# Patient Record
Sex: Male | Born: 1955 | Race: White | Hispanic: No | Marital: Married | Smoking: Former smoker
Health system: Southern US, Community
[De-identification: ages and names within clinical notes are randomized; demographics above are authoritative.]

## PROBLEM LIST (undated history)

## (undated) DIAGNOSIS — K219 Gastro-esophageal reflux disease without esophagitis: Secondary | ICD-10-CM

## (undated) DIAGNOSIS — K649 Unspecified hemorrhoids: Secondary | ICD-10-CM

## (undated) HISTORY — PX: CARDIAC CATHETERIZATION: SHX172

---

## 2003-05-28 ENCOUNTER — Other Ambulatory Visit: Admission: RE | Admit: 2003-05-28 | Discharge: 2003-05-28 | Payer: Self-pay | Admitting: Dermatology

## 2003-12-13 ENCOUNTER — Ambulatory Visit (HOSPITAL_COMMUNITY): Admission: RE | Admit: 2003-12-13 | Discharge: 2003-12-13 | Payer: Self-pay | Admitting: Family Medicine

## 2004-02-12 ENCOUNTER — Ambulatory Visit (HOSPITAL_COMMUNITY): Admission: RE | Admit: 2004-02-12 | Discharge: 2004-02-12 | Payer: Self-pay | Admitting: Family Medicine

## 2004-09-14 ENCOUNTER — Other Ambulatory Visit: Admission: RE | Admit: 2004-09-14 | Discharge: 2004-09-14 | Payer: Self-pay | Admitting: Dermatology

## 2005-03-01 ENCOUNTER — Ambulatory Visit (HOSPITAL_COMMUNITY): Admission: RE | Admit: 2005-03-01 | Discharge: 2005-03-01 | Payer: Self-pay | Admitting: Neurosurgery

## 2005-04-29 ENCOUNTER — Ambulatory Visit (HOSPITAL_COMMUNITY): Admission: RE | Admit: 2005-04-29 | Discharge: 2005-04-29 | Payer: Self-pay | Admitting: Family Medicine

## 2005-05-21 ENCOUNTER — Ambulatory Visit (HOSPITAL_COMMUNITY): Admission: RE | Admit: 2005-05-21 | Discharge: 2005-05-21 | Payer: Self-pay | Admitting: Preventative Medicine

## 2005-09-22 ENCOUNTER — Ambulatory Visit (HOSPITAL_COMMUNITY): Admission: RE | Admit: 2005-09-22 | Discharge: 2005-09-22 | Payer: Self-pay | Admitting: Family Medicine

## 2006-11-12 ENCOUNTER — Emergency Department (HOSPITAL_COMMUNITY): Admission: EM | Admit: 2006-11-12 | Discharge: 2006-11-12 | Payer: Self-pay | Admitting: Emergency Medicine

## 2008-05-15 ENCOUNTER — Ambulatory Visit (HOSPITAL_COMMUNITY): Admission: RE | Admit: 2008-05-15 | Discharge: 2008-05-15 | Payer: Self-pay | Admitting: General Surgery

## 2008-11-02 ENCOUNTER — Emergency Department (HOSPITAL_COMMUNITY): Admission: EM | Admit: 2008-11-02 | Discharge: 2008-11-02 | Payer: Self-pay | Admitting: Family Medicine

## 2010-08-17 LAB — BASIC METABOLIC PANEL
BUN: 15 mg/dL (ref 6–23)
CO2: 26 mEq/L (ref 19–32)
Calcium: 8.9 mg/dL (ref 8.4–10.5)
Chloride: 103 mEq/L (ref 96–112)
Creatinine, Ser: 0.86 mg/dL (ref 0.4–1.5)
GFR calc Af Amer: >60 mL/min (ref >60)
GFR calc non Af Amer: >60 mL/min (ref >60)
Glucose, Bld: 96 mg/dL (ref 70–99)
Potassium: 3.9 mEq/L (ref 3.5–5.1)
Sodium: 135 mEq/L (ref 135–145)

## 2010-08-17 LAB — CBC
HCT: 45.3 % (ref 39.0–52.0)
Hemoglobin: 15.3 g/dL (ref 13.0–17.0)
MCHC: 33.8 g/dL (ref 30.0–36.0)
MCV: 95.8 fL (ref 78.0–100.0)
Platelets: 174 10*3/uL (ref 150–400)
RBC: 4.73 MIL/uL (ref 4.22–5.81)
RDW: 11.9 % (ref 11.5–15.5)
WBC: 12.4 10*3/uL — ABNORMAL HIGH (ref 4.0–10.5)

## 2010-08-17 LAB — CULTURE, ROUTINE-ABSCESS

## 2010-08-17 LAB — ANAEROBIC CULTURE

## 2010-09-15 NOTE — Op Note (Signed)
NAME:  Robert Montoya, Robert Montoya NO.:  [id number]   MEDICAL RECORD NO.:  [id number]          PATIENT TYPE:  AMB   LOCATION:  DAY                           FACILITY:  [facility]   PHYSICIAN:  Dalia Heading, M.D.  DATE OF BIRTH:  1956-03-07   DATE OF PROCEDURE:  05/15/2008  DATE OF DISCHARGE:                               OPERATIVE REPORT   Age, 55 years old.   DATE OF PROCEDURE:  May 15, 2008   PREOPERATIVE DIAGNOSIS:  Scrotal abscess.   POSTOPERATIVE DIAGNOSIS:  Scrotal abscess.   PROCEDURE:  Incision and drainage of scrotal abscess.   SURGEON:  Dalia Heading, MD   ANESTHESIA:  General.   INDICATIONS:  The patient is a 55 year old white male who has developed  a scrotal abscess along the left upper aspect of the scrotum.  The risks  and benefits of the procedure were fully explained to the patient, gave  informed consent.   PROCEDURE NOTE:  The patient was placed in supine position.  After  general anesthesia was administered, the scrotum was prepped and draped  in the usual sterile technique with Betadine.  Surgical site  confirmation was performed.   The patient had a draining wound at the level of the penile base.  This  was to the left of the penile base.  The abscess cavity was opened up  and noted to be tracking up towards the inguinal canal.  Approximately 3-  4 cm incision was made.  Purulent fluid was found.  This was sent for  both aerobic and anaerobic cultures.  Any necrotic tissue was debrided.  The wound was then instilled with 0.5% Sensorcaine.  Betadine  impregnated packing was then placed into this wound.  A dry sterile  dressing was then applied.   All tape and needle counts were correct at the end of procedure.  The  patient was awakened and transferred to PACU in stable condition.  Complications none.  Specimens, aerobic and anaerobic cultures of  scrotal abscess.  Blood loss minimal.      Dalia Heading, M.D.  Electronically  Signed     MAJ/MEDQ  D:  05/15/2008  T:  05/16/2008  Job:  [id number]   cc:   Angus G. Renard Matter, MD  Fax: [phone]

## 2010-09-15 NOTE — H&P (Signed)
NAME:  Robert Montoya, DEZIEL NO.:  [id number]   MEDICAL RECORD NO.:  [id number]          PATIENT TYPE:  AMB   LOCATION:  DAY                           FACILITY:  [facility]   PHYSICIAN:  Dalia Heading, M.D.  DATE OF BIRTH:  23-Oct-1955   DATE OF ADMISSION:  DATE OF DISCHARGE:  LH                              HISTORY & PHYSICAL   CHIEF COMPLAINT:  Scrotal abscess.   HISTORY OF PRESENT ILLNESS:  The patient is a 55 year old white male who  is referred for evaluation and treatment of a scrotal abscess.  It  started developing several days ago.  Minimal drainage is noted.   PAST MEDICAL HISTORY:  Unremarkable.   PAST SURGICAL HISTORY:  Unremarkable.   CURRENT MEDICATIONS:  None.   ALLERGIES:  NO KNOWN DRUG ALLERGIES.   REVIEW OF SYSTEMS:  Noncontributory.   On physical examination, the patient is a well-developed, well-nourished  white male in no acute distress.  Lungs are clear to auscultation with equal breath sounds bilaterally.  Heart examination reveals a regular rate and rhythm without S3 or S4,  murmurs.  Genitourinary examination reveals an indurated area along the left  lateral aspect of the upper scrotum with minimal drainage noted.   IMPRESSION:  Scrotal abscess.   PLAN:  The patient is scheduled for incision and drainage of the scrotal  abscess on May 15, 2008.  The risks and benefits of the procedure,  including bleeding and infection, were fully explained to the patient,  who gave informed consent.  He has been started on Bactrim and Percocet  for pain.      Dalia Heading, M.D.  Electronically Signed     MAJ/MEDQ  D:  05/14/2008  T:  05/14/2008  Job:  [id number]   cc:   Dalia Heading, M.D.  Fax: [phone]   Short Stay Unit   Angus G. Renard Matter, MD  Fax: [phone]

## 2011-07-01 ENCOUNTER — Ambulatory Visit (HOSPITAL_COMMUNITY)
Admission: RE | Admit: 2011-07-01 | Discharge: 2011-07-01 | Disposition: A | Discharge status: Home / Self Care | Payer: 59 | Source: Ambulatory Visit | Attending: Family Medicine | Admitting: Family Medicine

## 2011-07-01 ENCOUNTER — Other Ambulatory Visit (HOSPITAL_COMMUNITY): Payer: Self-pay | Admitting: Family Medicine

## 2011-07-01 DIAGNOSIS — M542 Cervicalgia: Secondary | ICD-10-CM

## 2011-07-01 DIAGNOSIS — M47812 Spondylosis without myelopathy or radiculopathy, cervical region: Secondary | ICD-10-CM | POA: Insufficient documentation

## 2011-07-13 ENCOUNTER — Other Ambulatory Visit (HOSPITAL_COMMUNITY): Payer: Self-pay | Admitting: Family Medicine

## 2011-07-13 DIAGNOSIS — M542 Cervicalgia: Secondary | ICD-10-CM

## 2011-07-15 ENCOUNTER — Ambulatory Visit (HOSPITAL_COMMUNITY)
Admission: RE | Admit: 2011-07-15 | Discharge: 2011-07-15 | Disposition: A | Discharge status: Home / Self Care | Payer: 59 | Source: Ambulatory Visit | Attending: Family Medicine | Admitting: Family Medicine

## 2011-07-15 DIAGNOSIS — R209 Unspecified disturbances of skin sensation: Secondary | ICD-10-CM | POA: Insufficient documentation

## 2011-07-15 DIAGNOSIS — M5020 Other cervical disc displacement, unspecified cervical region: Secondary | ICD-10-CM | POA: Insufficient documentation

## 2011-07-15 DIAGNOSIS — M542 Cervicalgia: Secondary | ICD-10-CM | POA: Insufficient documentation

## 2011-07-15 DIAGNOSIS — J329 Chronic sinusitis, unspecified: Secondary | ICD-10-CM | POA: Insufficient documentation

## 2011-07-20 ENCOUNTER — Other Ambulatory Visit: Payer: Self-pay | Admitting: Family Medicine

## 2011-07-20 DIAGNOSIS — M542 Cervicalgia: Secondary | ICD-10-CM

## 2011-07-20 DIAGNOSIS — M79602 Pain in left arm: Secondary | ICD-10-CM

## 2011-07-22 ENCOUNTER — Ambulatory Visit
Admission: RE | Admit: 2011-07-22 | Discharge: 2011-07-22 | Disposition: A | Discharge status: Home / Self Care | Payer: 59 | Source: Ambulatory Visit | Attending: Family Medicine | Admitting: Family Medicine

## 2011-07-22 DIAGNOSIS — M79602 Pain in left arm: Secondary | ICD-10-CM

## 2011-07-22 DIAGNOSIS — M542 Cervicalgia: Secondary | ICD-10-CM

## 2011-07-22 MED ORDER — TRIAMCINOLONE ACETONIDE 40 MG/ML IJ SUSP (RADIOLOGY)
60.0000 mg | Freq: Once | INTRAMUSCULAR | Status: AC
  Administered 2011-07-22: 60 mg via EPIDURAL

## 2011-07-22 MED ORDER — IOHEXOL 300 MG/ML  SOLN
1.0000 mL | Freq: Once | INTRAMUSCULAR | Status: AC | PRN
  Administered 2011-07-22: 1 mL via EPIDURAL

## 2011-07-22 NOTE — Discharge Instructions (Signed)

## 2013-06-22 ENCOUNTER — Other Ambulatory Visit (HOSPITAL_COMMUNITY): Payer: Self-pay | Admitting: Family Medicine

## 2013-06-22 ENCOUNTER — Ambulatory Visit (HOSPITAL_COMMUNITY)
Admission: RE | Admit: 2013-06-22 | Discharge: 2013-06-22 | Disposition: A | Discharge status: Home / Self Care | Payer: 59 | Source: Ambulatory Visit | Attending: Family Medicine | Admitting: Family Medicine

## 2013-06-22 DIAGNOSIS — M25562 Pain in left knee: Secondary | ICD-10-CM

## 2013-06-22 DIAGNOSIS — M25569 Pain in unspecified knee: Secondary | ICD-10-CM | POA: Insufficient documentation

## 2013-06-27 ENCOUNTER — Other Ambulatory Visit (HOSPITAL_COMMUNITY): Payer: Self-pay | Admitting: Family Medicine

## 2013-06-27 DIAGNOSIS — M25562 Pain in left knee: Secondary | ICD-10-CM

## 2013-06-28 ENCOUNTER — Ambulatory Visit (HOSPITAL_COMMUNITY)
Admission: RE | Admit: 2013-06-28 | Discharge: 2013-06-28 | Disposition: A | Discharge status: Home / Self Care | Payer: 59 | Source: Ambulatory Visit | Attending: Family Medicine | Admitting: Family Medicine

## 2013-06-28 DIAGNOSIS — M25569 Pain in unspecified knee: Secondary | ICD-10-CM | POA: Insufficient documentation

## 2013-06-28 DIAGNOSIS — M2390 Unspecified internal derangement of unspecified knee: Secondary | ICD-10-CM | POA: Insufficient documentation

## 2013-06-28 DIAGNOSIS — M25562 Pain in left knee: Secondary | ICD-10-CM

## 2013-08-28 ENCOUNTER — Encounter (INDEPENDENT_AMBULATORY_CARE_PROVIDER_SITE_OTHER): Payer: Self-pay | Admitting: *Deleted

## 2013-10-15 ENCOUNTER — Encounter (INDEPENDENT_AMBULATORY_CARE_PROVIDER_SITE_OTHER): Payer: Self-pay | Admitting: *Deleted

## 2013-10-15 ENCOUNTER — Telehealth (INDEPENDENT_AMBULATORY_CARE_PROVIDER_SITE_OTHER): Payer: Self-pay | Admitting: *Deleted

## 2013-10-15 ENCOUNTER — Other Ambulatory Visit (INDEPENDENT_AMBULATORY_CARE_PROVIDER_SITE_OTHER): Payer: Self-pay | Admitting: *Deleted

## 2013-10-15 DIAGNOSIS — Z1211 Encounter for screening for malignant neoplasm of colon: Secondary | ICD-10-CM

## 2013-10-15 NOTE — Telephone Encounter (Signed)
Patient needs movi prep 

## 2013-10-24 MED ORDER — PEG-KCL-NACL-NASULF-NA ASC-C 100 G PO SOLR: 1.0000 | Freq: Once | ORAL | Status: DC

## 2013-11-14 ENCOUNTER — Telehealth (INDEPENDENT_AMBULATORY_CARE_PROVIDER_SITE_OTHER): Payer: Self-pay | Admitting: *Deleted

## 2013-11-14 NOTE — Telephone Encounter (Signed)
  Procedure: tcs  Reason/Indication:  screening  Has patient had this procedure before?  Yes, 2000  If so, when, by whom and where?    Is there a family history of colon cancer?  no  Who?  What age when diagnosed?    Is patient diabetic?   no      Does patient have prosthetic heart valve?  no  Do you have a pacemaker?  no  Has patient ever had endocarditis? no  Has patient had joint replacement within last 12 months?  no  Does patient tend to be constipated or take laxatives? no  Is patient on Coumadin, Plavix and/or Aspirin? yes  Medications: asa 81 mg daily  Allergies: nkda  Medication Adjustment: asa 2 days  Procedure date & time: 12/05/13 at 12

## 2013-11-15 NOTE — Telephone Encounter (Signed)
agree

## 2013-11-22 ENCOUNTER — Encounter (HOSPITAL_COMMUNITY): Payer: Self-pay | Admitting: Pharmacy Technician

## 2013-12-05 ENCOUNTER — Encounter (HOSPITAL_COMMUNITY): Payer: Self-pay | Admitting: *Deleted

## 2013-12-05 ENCOUNTER — Encounter (HOSPITAL_COMMUNITY)
Admission: RE | Disposition: A | Discharge status: Home / Self Care | Payer: Self-pay | Source: Ambulatory Visit | Attending: Internal Medicine

## 2013-12-05 ENCOUNTER — Ambulatory Visit (HOSPITAL_COMMUNITY)
Admission: RE | Admit: 2013-12-05 | Discharge: 2013-12-05 | Disposition: A | Discharge status: Home / Self Care | Payer: 59 | Source: Ambulatory Visit | Attending: Internal Medicine | Admitting: Internal Medicine

## 2013-12-05 DIAGNOSIS — K644 Residual hemorrhoidal skin tags: Secondary | ICD-10-CM | POA: Insufficient documentation

## 2013-12-05 DIAGNOSIS — Z1211 Encounter for screening for malignant neoplasm of colon: Secondary | ICD-10-CM | POA: Insufficient documentation

## 2013-12-05 DIAGNOSIS — K5730 Diverticulosis of large intestine without perforation or abscess without bleeding: Secondary | ICD-10-CM

## 2013-12-05 DIAGNOSIS — Z800 Family history of malignant neoplasm of digestive organs: Secondary | ICD-10-CM

## 2013-12-05 DIAGNOSIS — F17200 Nicotine dependence, unspecified, uncomplicated: Secondary | ICD-10-CM | POA: Insufficient documentation

## 2013-12-05 DIAGNOSIS — D126 Benign neoplasm of colon, unspecified: Secondary | ICD-10-CM

## 2013-12-05 DIAGNOSIS — Z79899 Other long term (current) drug therapy: Secondary | ICD-10-CM | POA: Insufficient documentation

## 2013-12-05 DIAGNOSIS — Z7982 Long term (current) use of aspirin: Secondary | ICD-10-CM | POA: Insufficient documentation

## 2013-12-05 DIAGNOSIS — K219 Gastro-esophageal reflux disease without esophagitis: Secondary | ICD-10-CM | POA: Insufficient documentation

## 2013-12-05 HISTORY — DX: Gastro-esophageal reflux disease without esophagitis: K21.9

## 2013-12-05 HISTORY — PX: COLONOSCOPY: SHX5424

## 2013-12-05 SURGERY — COLONOSCOPY
Anesthesia: Moderate Sedation

## 2013-12-05 MED ORDER — MIDAZOLAM HCL 5 MG/5ML IJ SOLN
INTRAMUSCULAR | Status: AC
  Filled 2013-12-05: qty 10

## 2013-12-05 MED ORDER — MEPERIDINE HCL 50 MG/ML IJ SOLN
INTRAMUSCULAR | Status: AC
  Filled 2013-12-05: qty 1

## 2013-12-05 MED ORDER — MEPERIDINE HCL 50 MG/ML IJ SOLN
INTRAMUSCULAR | Status: DC | PRN
  Administered 2013-12-05 (×2): 25 mg via INTRAVENOUS

## 2013-12-05 MED ORDER — STERILE WATER FOR IRRIGATION IR SOLN
Status: DC | PRN
  Administered 2013-12-05: 11:00:00

## 2013-12-05 MED ORDER — MIDAZOLAM HCL 5 MG/5ML IJ SOLN
INTRAMUSCULAR | Status: DC | PRN
  Administered 2013-12-05 (×4): 2 mg via INTRAVENOUS
  Administered 2013-12-05: 1 mg via INTRAVENOUS

## 2013-12-05 MED ORDER — SODIUM CHLORIDE 0.9 % IV SOLN
INTRAVENOUS | Status: DC
  Administered 2013-12-05: 11:00:00 via INTRAVENOUS

## 2013-12-05 NOTE — Discharge Instructions (Signed)
No aspirin or NSAIDs for 1 week. Resume other medications and diet as before. No driving for 24 hours.  Colon Polyps Polyps are lumps of extra tissue growing inside the body. Polyps can grow in the large intestine (colon). Most colon polyps are noncancerous (benign). However, some colon polyps can become cancerous over time. Polyps that are larger than a pea may be harmful. To be safe, caregivers remove and test all polyps. CAUSES  Polyps form when mutations in the genes cause your cells to grow and divide even though no more tissue is needed. RISK FACTORS There are a number of risk factors that can increase your chances of getting colon polyps. They include: Being older than 50 years. Family history of colon polyps or colon cancer. Long-term colon diseases, such as colitis or Crohn disease. Being overweight. Smoking. Being inactive. Drinking too much alcohol. SYMPTOMS  Most small polyps do not cause symptoms. If symptoms are present, they may include: Blood in the stool. The stool may look dark red or black. Constipation or diarrhea that lasts longer than 1 week. DIAGNOSIS People often do not know they have polyps until their caregiver finds them during a regular checkup. Your caregiver can use 4 tests to check for polyps: Digital rectal exam. The caregiver wears gloves and feels inside the rectum. This test would find polyps only in the rectum. Barium enema. The caregiver puts a liquid called barium into your rectum before taking X-rays of your colon. Barium makes your colon look white. Polyps are dark, so they are easy to see in the X-ray pictures. Sigmoidoscopy. A thin, flexible tube (sigmoidoscope) is placed into your rectum. The sigmoidoscope has a light and tiny camera in it. The caregiver uses the sigmoidoscope to look at the last third of your colon. Colonoscopy. This test is like sigmoidoscopy, but the caregiver looks at the entire colon. This is the most common method for finding  and removing polyps. TREATMENT  Any polyps will be removed during a sigmoidoscopy or colonoscopy. The polyps are then tested for cancer. PREVENTION  To help lower your risk of getting more colon polyps: Eat plenty of fruits and vegetables. Avoid eating fatty foods. Do not smoke. Avoid drinking alcohol. Exercise every day. Lose weight if recommended by your caregiver. Eat plenty of calcium and folate. Foods that are rich in calcium include milk, cheese, and broccoli. Foods that are rich in folate include chickpeas, kidney beans, and spinach. HOME CARE INSTRUCTIONS Keep all follow-up appointments as directed by your caregiver. You may need periodic exams to check for polyps. SEEK MEDICAL CARE IF: You notice bleeding during a bowel movement. Document Released: 01/14/2004 Document Revised: 07/12/2011 Document Reviewed: 06/29/2011 [facility] Patient Information 2015 [place], [place]. This information is not intended to replace advice given to you by your health care provider. Make sure you discuss any questions you have with your health care provider. Physician will call with biopsy results Colonoscopy, Care After Refer to this sheet in the next few weeks. These instructions provide you with information on caring for yourself after your procedure. Your health care provider may also give you more specific instructions. Your treatment has been planned according to current medical practices, but problems sometimes occur. Call your health care provider if you have any problems or questions after your procedure. WHAT TO EXPECT AFTER THE PROCEDURE  After your procedure, it is typical to have the following:  A small amount of blood in your stool.  Moderate amounts of gas and mild abdominal cramping  or bloating. HOME CARE INSTRUCTIONS  Do not drive, operate machinery, or sign important documents for 24 hours.  You may shower and resume your regular physical activities, but move at a slower pace for the  first 24 hours.  Take frequent rest periods for the first 24 hours.  Walk around or put a warm pack on your abdomen to help reduce abdominal cramping and bloating.  Drink enough fluids to keep your urine clear or pale yellow.  You may resume your normal diet as instructed by your health care provider. Avoid heavy or fried foods that are hard to digest.  Avoid drinking alcohol for 24 hours or as instructed by your health care provider.  Only take over-the-counter or prescription medicines as directed by your health care provider.  If a tissue sample (biopsy) was taken during your procedure:  Do not take aspirin or blood thinners for 7 days, or as instructed by your health care provider.  Do not drink alcohol for 7 days, or as instructed by your health care provider.  Eat soft foods for the first 24 hours. SEEK MEDICAL CARE IF: You have persistent spotting of blood in your stool 2-3 days after the procedure. SEEK IMMEDIATE MEDICAL CARE IF:  You have more than a small spotting of blood in your stool.  You pass large blood clots in your stool.  Your abdomen is swollen (distended).  You have nausea or vomiting.  You have a fever.  You have increasing abdominal pain that is not relieved with medicine. Document Released: 12/02/2003 Document Revised: 02/07/2013 Document Reviewed: 12/25/2012 [facility] Patient Information 2015 [place], [place]. This information is not intended to replace advice given to you by your health care provider. Make sure you discuss any questions you have with your health care provider.

## 2013-12-05 NOTE — H&P (Signed)
Robert Montoya is an 58 y.o. male.   Chief Complaint: Patient is here for colonoscopy. HPI: Patient 63-year-old Caucasian male who is here for screening colonoscopy. His last exam was in 2000. He has occasional hematochezia felt his hemorrhoids. He denies abdominal pain. History significant for ulcerative colitis and younger brother. Maternal uncle had colon carcinoma age 59 and paternal aunt had colon cancer age 71.  Past Medical History  Diagnosis Date  . GERD (gastroesophageal reflux disease)     Past Surgical History  Procedure Laterality Date  . Cardiac catheterization      Family History  Problem Relation Age of Onset  . Colon cancer Maternal Uncle   . Colon cancer Paternal Aunt    Social History:  reports that he has been smoking Cigarettes.  He has been smoking about 0.25 packs per day. He does not have any smokeless tobacco history on file. He reports that he does not drink alcohol. His drug history is not on file.  Allergies: No Known Allergies  Medications Prior to Admission  Medication Sig Dispense Refill  . aspirin EC 81 MG tablet Take 81 mg by mouth daily.      Marland Kitchen doxycycline (VIBRA-TABS) 100 MG tablet Take 100 mg by mouth 2 (two) times daily. Take 2 tablets x 10 days, then 1 tablet daily there after. Starting 11/21/2013.      . peg 3350 powder (MOVIPREP) 100 G SOLR Take 1 kit (200 g total) by mouth once.  1 kit  0  . pantoprazole (PROTONIX) 40 MG tablet Take 40 mg by mouth daily as needed (indigestion).        No results found for this or any previous visit (from the past 48 hour(s)). No results found.  ROS  Blood pressure 144/86, pulse 78, temperature 98 F (36.7 C), temperature source Oral, resp. rate 18, height _0  (1.727 m), weight 190 lb (86.183 kg), SpO2 97.00%. Physical Exam  Constitutional: He appears well-developed and well-nourished.  HENT:  Mouth/Throat: Oropharynx is clear and moist.  Eyes: Conjunctivae are normal. No scleral icterus.  Neck: No  thyromegaly present.  Cardiovascular: Normal rate, regular rhythm and normal heart sounds.   No murmur heard. Respiratory: Effort normal and breath sounds normal.  GI: Soft. He exhibits no distension and no mass. There is no tenderness.  Musculoskeletal: He exhibits no edema.  Lymphadenopathy:    He has no cervical adenopathy.  Neurological: He is alert.  Skin: Skin is warm and dry.     Assessment/Plan Screening colonoscopy. Family history of colon carcinoma in two second-degree relatives.  Robert Montoya U 12/05/2013, 10:49 AM

## 2013-12-05 NOTE — Op Note (Addendum)
COLONOSCOPY PROCEDURE REPORT  PATIENT:  Robert Montoya  MR#:  [id number] Birthdate:  [birth year], 58 y.o., male Endoscopist:  Dr. Rogene Houston, MD Referred By:  Dr. Starr Sinclair. Everette Rank, MD  Procedure Date: 12/05/2013  Procedure:   Colonoscopy  Indications:  Patient is 58 year-old Caucasian male who is undergoing screening colonoscopy. Family history significant for colon carcinoma maternal uncle when he was around 51 and paternal aunt when she was around 51.  Informed Consent:  The procedure and risks were reviewed with the patient and informed consent was obtained.  Medications:  Demerol 50 mg IV Versed 9 mg IV  Description of procedure:  After a digital rectal exam was performed, that colonoscope was advanced from the anus through the rectum and colon to the area of the cecum, ileocecal valve and appendiceal orifice. The cecum was deeply intubated. These structures were well-seen and photographed for the record. From the level of the cecum and ileocecal valve, the scope was slowly and cautiously withdrawn. The mucosal surfaces were carefully surveyed utilizing scope tip to flexion to facilitate fold flattening as needed. The scope was pulled down into the rectum where a thorough exam including retroflexion was performed.  Findings:   Prep satisfactory. Small cecal polyp ablated via cold biopsy. 10 mm polyp snared from junction of descending and sigmoid colon. Two small diverticula at sigmoid colon. Normal rectal mucosa. Small hemorrhoids noted below the dentate line.   Therapeutic/Diagnostic Maneuvers Performed:  See above  Complications:  None  Cecal Withdrawal Time:  21 minutes  Impression:  Examination performed to cecum. Small cecal polyp ablated via cold biopsy. 10 mm polyp snared from junction of descending and sigmoid colon. Two small diverticuli at sigmoid colon. External hemorrhoids.  Recommendations:  Standard instructions given. I will contact patient with biopsy  results and further recommendations.  REHMAN,NAJEEB U  12/05/2013 11:37 AM  CC: Dr. Lanette Hampshire, MD & Dr. Rayne Du ref. provider found

## 2013-12-10 ENCOUNTER — Encounter (HOSPITAL_COMMUNITY): Payer: Self-pay | Admitting: Internal Medicine

## 2013-12-18 ENCOUNTER — Encounter (INDEPENDENT_AMBULATORY_CARE_PROVIDER_SITE_OTHER): Payer: Self-pay | Admitting: *Deleted

## 2014-01-14 ENCOUNTER — Ambulatory Visit (HOSPITAL_COMMUNITY)
Admission: RE | Admit: 2014-01-14 | Discharge: 2014-01-14 | Disposition: A | Discharge status: Home / Self Care | Payer: 59 | Source: Ambulatory Visit | Attending: Family Medicine | Admitting: Family Medicine

## 2014-01-14 ENCOUNTER — Other Ambulatory Visit (HOSPITAL_COMMUNITY): Payer: Self-pay | Admitting: Family Medicine

## 2014-01-14 DIAGNOSIS — R0600 Dyspnea, unspecified: Secondary | ICD-10-CM

## 2014-01-14 DIAGNOSIS — R509 Fever, unspecified: Secondary | ICD-10-CM | POA: Diagnosis not present

## 2014-01-14 DIAGNOSIS — R05 Cough: Secondary | ICD-10-CM

## 2014-01-14 DIAGNOSIS — R0609 Other forms of dyspnea: Secondary | ICD-10-CM | POA: Insufficient documentation

## 2014-01-14 DIAGNOSIS — R059 Cough, unspecified: Secondary | ICD-10-CM | POA: Diagnosis present

## 2014-01-14 DIAGNOSIS — R0989 Other specified symptoms and signs involving the circulatory and respiratory systems: Secondary | ICD-10-CM | POA: Insufficient documentation

## 2016-03-13 ENCOUNTER — Ambulatory Visit (HOSPITAL_COMMUNITY)
Admission: RE | Admit: 2016-03-13 | Discharge: 2016-03-13 | Disposition: A | Discharge status: Home / Self Care | Payer: BLUE CROSS/BLUE SHIELD | Source: Ambulatory Visit | Attending: Internal Medicine | Admitting: Internal Medicine

## 2016-03-13 ENCOUNTER — Other Ambulatory Visit (HOSPITAL_COMMUNITY): Payer: Self-pay | Admitting: Internal Medicine

## 2016-03-13 DIAGNOSIS — R05 Cough: Secondary | ICD-10-CM | POA: Diagnosis not present

## 2016-03-13 DIAGNOSIS — R0600 Dyspnea, unspecified: Secondary | ICD-10-CM | POA: Insufficient documentation

## 2016-03-13 DIAGNOSIS — J209 Acute bronchitis, unspecified: Secondary | ICD-10-CM

## 2017-09-19 ENCOUNTER — Other Ambulatory Visit (HOSPITAL_COMMUNITY): Payer: Self-pay | Admitting: Family Medicine

## 2017-09-19 DIAGNOSIS — Z79899 Other long term (current) drug therapy: Secondary | ICD-10-CM

## 2017-09-29 ENCOUNTER — Inpatient Hospital Stay (HOSPITAL_COMMUNITY)
Admission: RE | Admit: 2017-09-29 | Discharge: 2017-09-29 | Disposition: A | Discharge status: Home / Self Care | Payer: BLUE CROSS/BLUE SHIELD | Source: Ambulatory Visit | Attending: Family Medicine | Admitting: Family Medicine

## 2017-09-29 ENCOUNTER — Encounter (HOSPITAL_COMMUNITY): Payer: Self-pay

## 2018-06-17 DIAGNOSIS — E785 Hyperlipidemia, unspecified: Secondary | ICD-10-CM | POA: Diagnosis not present

## 2018-06-17 DIAGNOSIS — I10 Essential (primary) hypertension: Secondary | ICD-10-CM | POA: Diagnosis not present

## 2018-06-19 DIAGNOSIS — I10 Essential (primary) hypertension: Secondary | ICD-10-CM | POA: Diagnosis not present

## 2018-06-19 DIAGNOSIS — Z87891 Personal history of nicotine dependence: Secondary | ICD-10-CM | POA: Diagnosis not present

## 2018-06-19 DIAGNOSIS — Z79899 Other long term (current) drug therapy: Secondary | ICD-10-CM | POA: Diagnosis not present

## 2018-06-19 DIAGNOSIS — E785 Hyperlipidemia, unspecified: Secondary | ICD-10-CM | POA: Diagnosis not present

## 2018-06-19 DIAGNOSIS — R062 Wheezing: Secondary | ICD-10-CM | POA: Diagnosis not present

## 2018-09-15 DIAGNOSIS — E559 Vitamin D deficiency, unspecified: Secondary | ICD-10-CM | POA: Diagnosis not present

## 2018-09-15 DIAGNOSIS — Z0000 Encounter for general adult medical examination without abnormal findings: Secondary | ICD-10-CM | POA: Diagnosis not present

## 2018-09-15 DIAGNOSIS — Z79899 Other long term (current) drug therapy: Secondary | ICD-10-CM | POA: Diagnosis not present

## 2018-09-15 DIAGNOSIS — E785 Hyperlipidemia, unspecified: Secondary | ICD-10-CM | POA: Diagnosis not present

## 2018-09-15 DIAGNOSIS — I10 Essential (primary) hypertension: Secondary | ICD-10-CM | POA: Diagnosis not present

## 2018-09-20 DIAGNOSIS — I10 Essential (primary) hypertension: Secondary | ICD-10-CM | POA: Diagnosis not present

## 2018-09-20 DIAGNOSIS — E785 Hyperlipidemia, unspecified: Secondary | ICD-10-CM | POA: Diagnosis not present

## 2018-09-20 DIAGNOSIS — Z0001 Encounter for general adult medical examination with abnormal findings: Secondary | ICD-10-CM | POA: Diagnosis not present

## 2018-12-11 ENCOUNTER — Encounter (INDEPENDENT_AMBULATORY_CARE_PROVIDER_SITE_OTHER): Payer: Self-pay | Admitting: *Deleted

## 2018-12-25 DIAGNOSIS — E785 Hyperlipidemia, unspecified: Secondary | ICD-10-CM | POA: Diagnosis not present

## 2018-12-25 DIAGNOSIS — I10 Essential (primary) hypertension: Secondary | ICD-10-CM | POA: Diagnosis not present

## 2018-12-27 DIAGNOSIS — I10 Essential (primary) hypertension: Secondary | ICD-10-CM | POA: Diagnosis not present

## 2018-12-27 DIAGNOSIS — E781 Pure hyperglyceridemia: Secondary | ICD-10-CM | POA: Diagnosis not present

## 2019-03-14 DIAGNOSIS — Z23 Encounter for immunization: Secondary | ICD-10-CM | POA: Diagnosis not present

## 2019-04-22 DIAGNOSIS — R05 Cough: Secondary | ICD-10-CM | POA: Diagnosis not present

## 2019-04-22 DIAGNOSIS — Z20828 Contact with and (suspected) exposure to other viral communicable diseases: Secondary | ICD-10-CM | POA: Diagnosis not present

## 2019-04-22 DIAGNOSIS — J069 Acute upper respiratory infection, unspecified: Secondary | ICD-10-CM | POA: Diagnosis not present

## 2019-05-10 DIAGNOSIS — M542 Cervicalgia: Secondary | ICD-10-CM | POA: Diagnosis not present

## 2019-05-10 DIAGNOSIS — K219 Gastro-esophageal reflux disease without esophagitis: Secondary | ICD-10-CM | POA: Diagnosis not present

## 2019-06-08 DIAGNOSIS — E785 Hyperlipidemia, unspecified: Secondary | ICD-10-CM | POA: Diagnosis not present

## 2019-06-08 DIAGNOSIS — I10 Essential (primary) hypertension: Secondary | ICD-10-CM | POA: Diagnosis not present

## 2019-06-14 DIAGNOSIS — I10 Essential (primary) hypertension: Secondary | ICD-10-CM | POA: Diagnosis not present

## 2019-06-14 DIAGNOSIS — R202 Paresthesia of skin: Secondary | ICD-10-CM | POA: Diagnosis not present

## 2019-06-14 DIAGNOSIS — M542 Cervicalgia: Secondary | ICD-10-CM | POA: Diagnosis not present

## 2019-07-02 ENCOUNTER — Ambulatory Visit (HOSPITAL_COMMUNITY)
Admission: RE | Admit: 2019-07-02 | Discharge: 2019-07-02 | Disposition: A | Discharge status: Home / Self Care | Payer: PPO | Source: Ambulatory Visit | Attending: Adult Health | Admitting: Adult Health

## 2019-07-02 ENCOUNTER — Other Ambulatory Visit (HOSPITAL_COMMUNITY): Payer: Self-pay | Admitting: Adult Health

## 2019-07-02 ENCOUNTER — Other Ambulatory Visit: Payer: Self-pay

## 2019-07-02 DIAGNOSIS — M542 Cervicalgia: Secondary | ICD-10-CM

## 2019-07-25 DIAGNOSIS — R202 Paresthesia of skin: Secondary | ICD-10-CM | POA: Diagnosis not present

## 2019-07-25 DIAGNOSIS — I10 Essential (primary) hypertension: Secondary | ICD-10-CM | POA: Diagnosis not present

## 2019-07-25 DIAGNOSIS — M542 Cervicalgia: Secondary | ICD-10-CM | POA: Diagnosis not present

## 2019-08-12 ENCOUNTER — Observation Stay (HOSPITAL_COMMUNITY): Payer: PPO

## 2019-08-12 ENCOUNTER — Observation Stay (HOSPITAL_COMMUNITY)
Admission: EM | Admit: 2019-08-12 | Discharge: 2019-08-13 | Disposition: A | Discharge status: Home / Self Care | Payer: PPO | Attending: Internal Medicine | Admitting: Internal Medicine

## 2019-08-12 ENCOUNTER — Encounter (HOSPITAL_COMMUNITY): Payer: Self-pay | Admitting: Emergency Medicine

## 2019-08-12 ENCOUNTER — Emergency Department (HOSPITAL_COMMUNITY): Payer: PPO

## 2019-08-12 ENCOUNTER — Ambulatory Visit
Admission: EM | Admit: 2019-08-12 | Discharge: 2019-08-12 | Disposition: A | Discharge status: Home / Self Care | Payer: PPO | Source: Home / Self Care

## 2019-08-12 ENCOUNTER — Other Ambulatory Visit: Payer: Self-pay

## 2019-08-12 DIAGNOSIS — K8020 Calculus of gallbladder without cholecystitis without obstruction: Secondary | ICD-10-CM | POA: Diagnosis not present

## 2019-08-12 DIAGNOSIS — K5730 Diverticulosis of large intestine without perforation or abscess without bleeding: Secondary | ICD-10-CM | POA: Diagnosis not present

## 2019-08-12 DIAGNOSIS — F17210 Nicotine dependence, cigarettes, uncomplicated: Secondary | ICD-10-CM | POA: Diagnosis not present

## 2019-08-12 DIAGNOSIS — K644 Residual hemorrhoidal skin tags: Secondary | ICD-10-CM | POA: Insufficient documentation

## 2019-08-12 DIAGNOSIS — K921 Melena: Secondary | ICD-10-CM | POA: Diagnosis not present

## 2019-08-12 DIAGNOSIS — D122 Benign neoplasm of ascending colon: Secondary | ICD-10-CM | POA: Diagnosis not present

## 2019-08-12 DIAGNOSIS — I10 Essential (primary) hypertension: Secondary | ICD-10-CM | POA: Diagnosis not present

## 2019-08-12 DIAGNOSIS — K625 Hemorrhage of anus and rectum: Secondary | ICD-10-CM | POA: Insufficient documentation

## 2019-08-12 DIAGNOSIS — D120 Benign neoplasm of cecum: Secondary | ICD-10-CM | POA: Diagnosis not present

## 2019-08-12 DIAGNOSIS — D123 Benign neoplasm of transverse colon: Secondary | ICD-10-CM | POA: Insufficient documentation

## 2019-08-12 DIAGNOSIS — Z720 Tobacco use: Secondary | ICD-10-CM | POA: Diagnosis not present

## 2019-08-12 DIAGNOSIS — Z20822 Contact with and (suspected) exposure to covid-19: Secondary | ICD-10-CM | POA: Insufficient documentation

## 2019-08-12 DIAGNOSIS — Z03818 Encounter for observation for suspected exposure to other biological agents ruled out: Secondary | ICD-10-CM | POA: Diagnosis not present

## 2019-08-12 DIAGNOSIS — Z79899 Other long term (current) drug therapy: Secondary | ICD-10-CM | POA: Diagnosis not present

## 2019-08-12 DIAGNOSIS — I700 Atherosclerosis of aorta: Secondary | ICD-10-CM | POA: Diagnosis not present

## 2019-08-12 HISTORY — DX: Unspecified hemorrhoids: K64.9

## 2019-08-12 LAB — COMPREHENSIVE METABOLIC PANEL
ALT: 18 U/L (ref 0–44)
AST: 19 U/L (ref 15–41)
Albumin: 4.4 g/dL (ref 3.5–5.0)
Alkaline Phosphatase: 67 U/L (ref 38–126)
Anion gap: 8 (ref 5–15)
BUN: 19 mg/dL (ref 8–23)
CO2: 26 mmol/L (ref 22–32)
Calcium: 9 mg/dL (ref 8.9–10.3)
Chloride: 104 mmol/L (ref 98–111)
Creatinine, Ser: 0.83 mg/dL (ref 0.61–1.24)
GFR calc Af Amer: >60 mL/min (ref >60)
GFR calc non Af Amer: >60 mL/min (ref >60)
Glucose, Bld: 106 mg/dL — ABNORMAL HIGH (ref 70–99)
Potassium: 4.5 mmol/L (ref 3.5–5.1)
Sodium: 138 mmol/L (ref 135–145)
Total Bilirubin: 0.8 mg/dL (ref 0.3–1.2)
Total Protein: 6.9 g/dL (ref 6.5–8.1)

## 2019-08-12 LAB — RESPIRATORY PANEL BY RT PCR (FLU A&B, COVID)
Influenza A by PCR: NEGATIVE
Influenza B by PCR: NEGATIVE
SARS Coronavirus 2 by RT PCR: NEGATIVE

## 2019-08-12 LAB — CBC
HCT: 47.1 % (ref 39.0–52.0)
Hemoglobin: 16.5 g/dL (ref 13.0–17.0)
MCH: 33.5 pg (ref 26.0–34.0)
MCHC: 35 g/dL (ref 30.0–36.0)
MCV: 95.5 fL (ref 80.0–100.0)
Platelets: 181 10*3/uL (ref 150–400)
RBC: 4.93 MIL/uL (ref 4.22–5.81)
RDW: 11.9 % (ref 11.5–15.5)
WBC: 8.2 10*3/uL (ref 4.0–10.5)
nRBC: 0 % (ref 0.0–0.2)

## 2019-08-12 LAB — TYPE AND SCREEN
ABO/RH(D): O NEG
Antibody Screen: NEGATIVE

## 2019-08-12 LAB — POC OCCULT BLOOD, ED: Fecal Occult Bld: POSITIVE — AB

## 2019-08-12 MED ORDER — PEG 3350-KCL-NA BICARB-NACL 420 G PO SOLR
4000.0000 mL | Freq: Once | ORAL | Status: AC
  Administered 2019-08-12: 22:00:00 4000 mL via ORAL
  Filled 2019-08-12: qty 4000

## 2019-08-12 MED ORDER — ONDANSETRON HCL 4 MG/2ML IJ SOLN: 4.0000 mg | Freq: Four times a day (QID) | INTRAMUSCULAR | Status: DC | PRN

## 2019-08-12 MED ORDER — ACETAMINOPHEN 325 MG PO TABS: 650.0000 mg | ORAL_TABLET | Freq: Four times a day (QID) | ORAL | Status: DC | PRN

## 2019-08-12 MED ORDER — LISINOPRIL 10 MG PO TABS
20.0000 mg | ORAL_TABLET | Freq: Every day | ORAL | Status: DC
  Filled 2019-08-12 (×2): qty 2

## 2019-08-12 MED ORDER — SODIUM CHLORIDE 0.9 % IV SOLN: INTRAVENOUS | Status: DC

## 2019-08-12 MED ORDER — ACETAMINOPHEN 650 MG RE SUPP: 650.0000 mg | Freq: Four times a day (QID) | RECTAL | Status: DC | PRN

## 2019-08-12 MED ORDER — ONDANSETRON HCL 4 MG PO TABS: 4.0000 mg | ORAL_TABLET | Freq: Four times a day (QID) | ORAL | Status: DC | PRN

## 2019-08-12 MED ORDER — IOHEXOL 350 MG/ML SOLN
100.0000 mL | Freq: Once | INTRAVENOUS | Status: AC | PRN
  Administered 2019-08-12: 16:00:00 100 mL via INTRAVENOUS

## 2019-08-12 NOTE — H&P (Signed)
History and Physical  Robert Montoya [id number] DOB: February 06, 1956 DOA: 08/12/2019   PCP: Marjean Donna, MD (Inactive)   Patient coming from: Home  Chief Complaint: hematochezia  HPI:  Robert Montoya is a 64 y.o. male with medical history of hypertension and tobacco abuse presenting with hematochezia that started on 08/11/2019.  The patient states that he has had some intermittent hematochezia that he has noted on toilet paper intermittently for the past several months.  He relates it to when he usually takes his wife's Naprosyn for arthritis pain.  He denies any other NSAIDs.  He denies any alcohol or illicit drug use.  He states that he took his wife's Naprosyn on 08/10/2019 and 1421.  He denies any fevers, chills, chest pain, shortness breath, dizziness, abdominal pain, nausea, vomiting, diarrhea, melanotic stool, abdominal pain.  There is no dysuria or hematuria.  Notably, the patient had a colonoscopy performed on 12/05/2013 which showed a small cecal polyp as well as a polyp at the junction of the descending and sigmoid colon.  There were 2 small diverticula in the sigmoid colon at that time and small hemorrhoids. In the emergency department, the patient is afebrile and hemodynamically stable with oxygen saturation 98% room air.  BMP and CBC were unremarkable with hemoglobin 16.5.  EKG shows sinus rhythm without ST or T wave changes.  FOBT was positive with bright red stool.  GI was consulted to assist with management.  Assessment/Plan: Hematochezia -Suspect diverticular versus hemorrhoidal bleed -Appreciate GI consult -Planning Colonoscopy 08/13/2019 -Clear liquid diet -IV fluids  Essential hypertension -Continue lisinopril  Tobacco abuse -Tobacco cessation discussed         Past Medical History:  Diagnosis Date  . GERD (gastroesophageal reflux disease)   . Hemorrhoids    Past Surgical History:  Procedure Laterality Date  . CARDIAC CATHETERIZATION    . COLONOSCOPY N/A  12/05/2013   Procedure: COLONOSCOPY;  Surgeon: Rogene Houston, MD;  Location: [facility];  Service: Endoscopy;  Laterality: N/A;  1200   Social History:  reports that he has been smoking cigarettes. He has been smoking about 0.25 packs per day. He does not have any smokeless tobacco history on file. He reports that he does not drink alcohol or use drugs.   Family History  Problem Relation Age of Onset  . Colon cancer Maternal Uncle   . Colon cancer Paternal Aunt      No Known Allergies   Prior to Admission medications   Medication Sig Start Date End Date Taking? Authorizing Provider  lisinopril (ZESTRIL) 20 MG tablet Take 20 mg by mouth daily. 05/28/19  Yes [provider]  omeprazole (PRILOSEC) 20 MG capsule Take 20 mg by mouth daily. 05/10/19  Yes [provider]  pantoprazole (PROTONIX) 40 MG tablet Take 40 mg by mouth daily as needed (indigestion).   Yes [provider]    Review of Systems:  Constitutional:  No weight loss, night sweats, Fevers, chills, fatigue.  Head&Eyes: No headache.  No vision loss.  No eye pain or scotoma ENT:  No Difficulty swallowing,Tooth/dental problems,Sore throat,  No ear ache, post nasal drip,  Cardio-vascular:  No chest pain, Orthopnea, PND, swelling in lower extremities,  dizziness, palpitations  GI:  No  abdominal pain, nausea, vomiting, diarrhea, loss of appetite,  melena, heartburn, indigestion, Resp:  No shortness of breath with exertion or at rest. No cough. No coughing up of blood .No wheezing.No chest wall deformity  Skin:  no rash or lesions.  GU:  no dysuria, change in color of urine, no urgency or frequency. No flank pain.  Musculoskeletal:  No joint pain or swelling. No decreased range of motion. No back pain.  Psych:  No change in mood or affect. No depression or anxiety. Neurologic: No headache, no dysesthesia, no focal weakness, no vision loss. No syncope  Physical Exam: Vitals:   08/12/19  1242 08/12/19 1250 08/12/19 1253  BP: (!) 152/79  (!) 152/79  Pulse: 92  92  Resp: (!) 21  18  Temp: 98.1 F (36.7 C)  98.1 F (36.7 C)  TempSrc: Oral  Oral  SpO2: 98%  98%  Weight:  86.2 kg   Height:  5\' 8"  (1.727 m)    General:  A&O x 3, NAD, nontoxic, pleasant/cooperative Head/Eye: No conjunctival hemorrhage, no icterus, /AT, No nystagmus ENT:  No icterus,  No thrush, good dentition, no pharyngeal exudate Neck:  No masses, no lymphadenpathy, no bruits CV:  RRR, no rub, no gallop, no S3 Lung:  CTAB, good air movement, no wheeze, no rhonchi Abdomen: soft/NT, +BS, nondistended, no peritoneal signs Ext: No cyanosis, No rashes, No petechiae, No lymphangitis, No edema Neuro: CNII-XII intact, strength 4/5 in bilateral upper and lower extremities, no dysmetria  Labs on Admission:  Basic Metabolic Panel: Recent Labs  Lab 08/12/19 1301  NA 138  K 4.5  CL 104  CO2 26  GLUCOSE 106*  BUN 19  CREATININE 0.83  CALCIUM 9.0   Liver Function Tests: Recent Labs  Lab 08/12/19 1301  AST 19  ALT 18  ALKPHOS 67  BILITOT 0.8  PROT 6.9  ALBUMIN 4.4   No results for input(s): LIPASE, AMYLASE in the last 168 hours. No results for input(s): AMMONIA in the last 168 hours. CBC: Recent Labs  Lab 08/12/19 1301  WBC 8.2  HGB 16.5  HCT 47.1  MCV 95.5  PLT 181   Coagulation Profile: No results for input(s): INR, PROTIME in the last 168 hours. Cardiac Enzymes: No results for input(s): CKTOTAL, CKMB, CKMBINDEX, TROPONINI in the last 168 hours. BNP: Invalid input(s): POCBNP CBG: No results for input(s): GLUCAP in the last 168 hours. Urine analysis: No results found for: COLORURINE, APPEARANCEUR, LABSPEC, PHURINE, GLUCOSEU, HGBUR, BILIRUBINUR, KETONESUR, PROTEINUR, UROBILINOGEN, NITRITE, LEUKOCYTESUR Sepsis Labs: @LABRCNTIP (procalcitonin:4,lacticidven:4) )No results found for this or any previous visit (from the past 240 hour(s)).   Radiological Exams on Admission: No results  found.  EKG: Independently reviewed. Sinus, no STT changes    Time spent:60 minutes Code Status:    FULL Family Communication:  No Family at bedside Disposition Plan: expect 1-2 day hospitalization Consults called: GI DVT Prophylaxis: [place] Lovenox  Orson Eva, DO  [facility] Pager [phone]  If 7PM-7AM, please contact night-coverage [email or web address] Password [email or web address] 08/12/2019, 3:10 PM

## 2019-08-12 NOTE — ED Triage Notes (Signed)
Sent from UC   Pt w hx of hemorrhoids  Noticed rectal bleed last pm and this am   Sent here for eval

## 2019-08-12 NOTE — ED Triage Notes (Signed)
Pt reports starting with rectal bleeding this AM.  Describes experiencing episodes of rectal pressure followed by bright red blood in toilet.  Denies any pain.  Pt discussion with K. Avegno, NP.  Pt to go to ED immediately.  Pt verbalized understanding.

## 2019-08-12 NOTE — ED Provider Notes (Signed)
[facility] Provider Note   CSN: [id number] Arrival date & time: 08/12/19  1220     History Chief Complaint  Patient presents with  . Rectal Bleeding    Robert Montoya is a 64 y.o. male.  Patient presenting with 2-day history of bright red blood per rectum.  This morning and stained the toilet water all completely red.  Yesterday it was mostly with wiping.  Patient last had a colonoscopy in 2015 done by Dr. Melony Overly.  Patient denies any belly pain.  Or feeling lightheaded or feeling is in a pass out no upper respiratory symptoms no chest pain.        Past Medical History:  Diagnosis Date  . GERD (gastroesophageal reflux disease)   . Hemorrhoids     There are no problems to display for this patient.   Past Surgical History:  Procedure Laterality Date  . CARDIAC CATHETERIZATION    . COLONOSCOPY N/A 12/05/2013   Procedure: COLONOSCOPY;  Surgeon: Rogene Houston, MD;  Location: [facility];  Service: Endoscopy;  Laterality: N/A;  1200       Family History  Problem Relation Age of Onset  . Colon cancer Maternal Uncle   . Colon cancer Paternal Aunt     Social History   Tobacco Use  . Smoking status: Current Every Day Smoker    Packs/day: 0.25    Types: Cigarettes  Substance Use Topics  . Alcohol use: No  . Drug use: Never    Home Medications Prior to Admission medications   Medication Sig Start Date End Date Taking? Authorizing Provider  lisinopril (ZESTRIL) 20 MG tablet Take 20 mg by mouth daily. 05/28/19   [provider]  omeprazole (PRILOSEC) 20 MG capsule Take 20 mg by mouth daily. 05/10/19   [provider]  pantoprazole (PROTONIX) 40 MG tablet Take 40 mg by mouth daily as needed (indigestion).    [provider]    Allergies    Patient has no known allergies.  Review of Systems   Review of Systems  Constitutional: Negative for chills and fever.  HENT: Negative for rhinorrhea and sore throat.   Eyes:  Negative for visual disturbance.  Respiratory: Negative for cough and shortness of breath.   Cardiovascular: Negative for chest pain and leg swelling.  Gastrointestinal: Positive for rectal pain. Negative for abdominal pain, diarrhea, nausea and vomiting.  Genitourinary: Negative for dysuria.  Musculoskeletal: Negative for back pain and neck pain.  Skin: Negative for rash.  Neurological: Negative for dizziness, light-headedness and headaches.  Hematological: Does not bruise/bleed easily.  Psychiatric/Behavioral: Negative for confusion.    Physical Exam Updated Vital Signs BP (!) 152/79 (BP Location: Right Arm)   Pulse 92   Temp 98.1 F (36.7 C) (Oral)   Resp 18   Ht 1.727 m (5\' 8" )   Wt 86.2 kg   SpO2 98%   BMI 28.89 kg/m   Physical Exam Vitals and nursing note reviewed.  Constitutional:      Appearance: Normal appearance. He is well-developed.  HENT:     Head: Normocephalic and atraumatic.  Eyes:     Extraocular Movements: Extraocular movements intact.     Conjunctiva/sclera: Conjunctivae normal.     Pupils: Pupils are equal, round, and reactive to light.  Cardiovascular:     Rate and Rhythm: Normal rate and regular rhythm.     Heart sounds: No murmur.  Pulmonary:     Effort: Pulmonary effort is normal. No respiratory distress.  Breath sounds: Normal breath sounds.  Abdominal:     Palpations: Abdomen is soft.     Tenderness: There is no abdominal tenderness.  Genitourinary:    Rectum: Normal. Guaiac result positive.     Comments: Gross red blood rectal no internal hemorrhoid prolapse no fissure.  No external hemorrhoids.  There is a skin tag. Musculoskeletal:        General: No swelling. Normal range of motion.     Cervical back: Normal range of motion and neck supple.  Skin:    General: Skin is warm and dry.  Neurological:     Mental Status: He is alert.     ED Results / Procedures / Treatments   Labs (all labs ordered are listed, but only abnormal  results are displayed) Labs Reviewed  COMPREHENSIVE METABOLIC PANEL - Abnormal; Notable for the following components:      Result Value   Glucose, Bld 106 (*)    All other components within normal limits  CBC  POC OCCULT BLOOD, ED  TYPE AND SCREEN    EKG EKG Interpretation  Date/Time:  Sunday August 12 2019 12:41:13 EDT Ventricular Rate:  95 PR Interval:    QRS Duration: 88 QT Interval:  340 QTC Calculation: 428 R Axis:   49 Text Interpretation: Sinus rhythm Consider right atrial enlargement Confirmed by Fredia Sorrow [phone]) on 08/12/2019 12:57:36 PM   Radiology No results found.  Procedures Procedures (including critical care time)  Medications Ordered in ED Medications  0.9 %  sodium chloride infusion (has no administration in time range)    ED Course  I have reviewed the triage vital signs and the nursing notes.  Pertinent labs & imaging results that were available during my care of the patient were reviewed by me and considered in my medical decision making (see chart for details).    MDM Rules/Calculators/A&P                      Patient with significant GI bleed clinically on rectal exam.  But hemodynamically stable.  Concern would be for diverticular bleed.  Spoke with Dr. Melony Overly.  He is going to scope him in the morning.  He is requested actually a CT angio of the abdomen due to the bleed.  Also discussed with the hospitalist who will admit him.  Hemoglobin is stable here vital signs are stable abdomen is soft and nontender.   Final Clinical Impression(s) / ED Diagnoses Final diagnoses:  Rectal bleeding    Rx / DC Orders ED Discharge Orders    None       Fredia Sorrow, MD 08/12/19 1456

## 2019-08-12 NOTE — Consult Note (Addendum)
Referring Provider: Orson Eva, DO Primary Care Physician:  Jani Gravel , MD Primary Gastroenterologist:  Dr. Laural Golden  Reason for Consultation:    Rectal bleeding.  HPI:   Patient is 64 year old Caucasian male who has history of hypertension and GERD who was in usual state of health this morning when he had normal bowel movement.  Along with his bowel movement he noted large amount of fresh blood.  He did not experience abdominal pain nausea vomiting lightheadedness or diaphoreses.  He has history of hemorrhoids and he may see blood on the tissue every now and then when he is constipated but is never passed this much blood before.  He has good appetite.  He has not lost any weight.  He does not take aspirin or other NSAIDs on regular basis but he did take his wife's NSAID yesterday for musculoskeletal pain.  It appears that he took 500 mg of Naprosyn.  He may take a dose every week or every couple of days. Patient has history of colonic adenomas.  Last colonoscopy was in August 2015 with removal of small cecal tubular adenoma and 10 mm adenoma from sigmoid colon.  He also had 2 small sigmoid diverticula and small external hemorrhoids.  Patient was sent letter in August 2020 as a reminder for him to return for surveillance colonoscopy but he did not respond.  Patient is married.  He has 2 sons in good health.  He works for [employer].  He smokes 1 to 2 packs/day.  He has been smoking for several years.  He states his work is very physical.  He has 3 brothers and 1 sister living.  1 sister died of breast carcinoma at age 40 within few months or year of diagnosis.  Family history is positive for colon carcinoma in maternal uncle and paternal aunts.  They were both in their 20s or 60s at the time of diagnosis.  Past Medical History:  Diagnosis Date  . GERD (gastroesophageal reflux disease)   . Hemorrhoids        History of colonic adenomas.      Single gallstone noted on CT of 08/12/2019.       Hypertension  Past Surgical History:  Procedure Laterality Date  . CARDIAC CATHETERIZATION    . COLONOSCOPY N/A 12/05/2013   Procedure: COLONOSCOPY;  Surgeon: Rogene Houston, MD;  Location: [facility];  Service: Endoscopy;  Laterality: N/A;  1200    Prior to Admission medications   Medication Sig Start Date End Date Taking? Authorizing Provider  lisinopril (ZESTRIL) 20 MG tablet Take 20 mg by mouth daily. 05/28/19  Yes [provider]  omeprazole (PRILOSEC) 20 MG capsule Take 20 mg by mouth daily. 05/10/19  Yes [provider]  pantoprazole (PROTONIX) 40 MG tablet Take 40 mg by mouth daily as needed (indigestion).   Yes [provider]    Current Facility-Administered Medications  Medication Dose Route Frequency Provider Last Rate Last Admin  . 0.9 %  sodium chloride infusion   Intravenous Continuous Fredia Sorrow, MD   Stopped at 08/12/19 1656  . 0.9 %  sodium chloride infusion   Intravenous Continuous Tat, Shanon Brow, MD 50 mL/hr at 08/12/19 1656 New Bag at 08/12/19 1656  . acetaminophen (TYLENOL) tablet 650 mg  650 mg Oral Q6H PRN Tat, David, MD       Or  . acetaminophen (TYLENOL) suppository 650 mg  650 mg Rectal Q6H PRN Orson Eva, MD      . Derrill Memo  ON 08/13/2019] lisinopril (ZESTRIL) tablet 20 mg  20 mg Oral Daily Tat, David, MD      . ondansetron [facility]) tablet 4 mg  4 mg Oral Q6H PRN Tat, David, MD       Or  . ondansetron (ZOFRAN) injection 4 mg  4 mg Intravenous Q6H PRN Orson Eva, MD        Allergies as of 08/12/2019  . (No Known Allergies)    Family History  Problem Relation Age of Onset  . Colon cancer Maternal Uncle   . Colon cancer Paternal Aunt     Social History   Socioeconomic History  . Marital status: Married    Spouse name: Not on file  . Number of children: Not on file  . Years of education: Not on file  . Highest education level: Not on file  Occupational History  . Not on file  Tobacco Use  . Smoking status: Current Every  Day Smoker    Packs/day: 0.25    Types: Cigarettes  Substance and Sexual Activity  . Alcohol use: No  . Drug use: Never  . Sexual activity: Not on file  Other Topics Concern  . Not on file  Social History Narrative  . Not on file   Social Determinants of Health   Financial Resource Strain:   . Difficulty of Paying Living Expenses:   Food Insecurity:   . Worried About Charity fundraiser in the Last Year:   . Arboriculturist in the Last Year:   Transportation Needs:   . Film/video editor (Medical):   Marland Kitchen Lack of Transportation (Non-Medical):   Physical Activity:   . Days of Exercise per Week:   . Minutes of Exercise per Session:   Stress:   . Feeling of Stress :   Social Connections:   . Frequency of Communication with Friends and Family:   . Frequency of Social Gatherings with Friends and Family:   . Attends Religious Services:   . Active Member of Clubs or Organizations:   . Attends Archivist Meetings:   Marland Kitchen Marital Status:   Intimate Partner Violence:   . Fear of Current or Ex-Partner:   . Emotionally Abused:   Marland Kitchen Physically Abused:   . Sexually Abused:     Review of Systems: See HPI, otherwise normal ROS  Physical Exam: Temp:  [98.1 F (36.7 C)-98.3 F (36.8 C)] 98.3 F (36.8 C) (04/11 1620) Pulse Rate:  [78-92] 78 (04/11 1620) Resp:  [18-21] 18 (04/11 1620) BP: (129-152)/(64-79) 129/64 (04/11 1620) SpO2:  [98 %] 98 % (04/11 1620) Weight:  [86.2 kg-98 kg] 98 kg (04/11 1620) Last BM Date: 08/12/19  Patient is alert and in no acute distress. Conjunctivae is pink and sclerae nonicteric. Oropharyngeal mucosa is normal. No neck masses thyromegaly or lymphadenopathy noted. Cardiac exam with regular rhythm normal S1 and S2.  No murmur gallop noted. Auscultation of lungs reveal vesicular breath sounds bilaterally. Abdomen is protuberant.  Bowel sounds are normal.  On palpation abdomen is soft and nontender without organomegaly or masses. No  peripheral edema or clubbing noted.  Lab Results: Recent Labs    08/12/19 1301  WBC 8.2  HGB 16.5  HCT 47.1  PLT 181   BMET Recent Labs    08/12/19 1301  NA 138  K 4.5  CL 104  CO2 26  GLUCOSE 106*  BUN 19  CREATININE 0.83  CALCIUM 9.0   LFT Recent Labs    08/12/19  1301  PROT 6.9  ALBUMIN 4.4  AST 19  ALT 18  ALKPHOS 67  BILITOT 0.8   PT/INR No results for input(s): LABPROT, INR in the last 72 hours. Hepatitis Panel No results for input(s): HEPBSAG, HCVAB, HEPAIGM, HEPBIGM in the last 72 hours.  Studies/Results: DG Chest Port 1 View  Result Date: 08/12/2019 CLINICAL DATA:  64 year old male with rectal bleeding. EXAM: PORTABLE CHEST 1 VIEW COMPARISON:  Chest radiograph dated 03/13/2016. FINDINGS: Minimal bibasilar atelectasis. There is no focal consolidation, pleural effusion or pneumothorax. The cardiac silhouette is within normal limits. No acute osseous pathology. Atherosclerotic calcification of the aorta. IMPRESSION: No active disease. Electronically Signed   By: Anner Crete M.D.   On: 08/12/2019 15:14   CT Angio Abd/Pel W and/or Wo Contrast  Result Date: 08/12/2019 CLINICAL DATA:  Rectal bleeding beginning this morning. EXAM: CTA ABDOMEN AND PELVIS WITHOUT AND WITH CONTRAST TECHNIQUE: Multidetector CT imaging of the abdomen and pelvis was performed using the standard protocol during bolus administration of intravenous contrast. Multiplanar reconstructed images and MIPs were obtained and reviewed to evaluate the vascular anatomy. CONTRAST:  13mL OMNIPAQUE IOHEXOL 350 MG/ML SOLN COMPARISON:  02/12/2004 FINDINGS: VASCULAR Aorta: Normal in caliber without aneurysm or dissection. Mild-to-moderate calcified plaque throughout the abdominal aorta most notable distally. Celiac: Normal. SMA: Normal. Renals: Single renal arteries are patent bilaterally without stenosis. Minimal calcified plaque over the origin. IMA: Normal. Inflow: Calcified plaque over the common iliac  arteries and femoral arteries. No evidence of focal stenosis or occlusion. Calcified plaque over the internal iliac arteries which are otherwise patent. Proximal Outflow: Normal. Veins: Unremarkable. Review of the MIP images confirms the above findings. NON-VASCULAR Lower chest: Lung bases are normal. Hepatobiliary: Liver and biliary tree are normal. Single 8-9 mm gallstone. Pancreas: Normal. Spleen: Normal. Adrenals/Urinary Tract: Adrenal glands are normal. Kidneys are normal in size without hydronephrosis or focal mass. Ureters and bladder are normal. Stomach/Bowel: Stomach and small bowel are normal. Appendix is normal. Colon is normal. Lymphatic: No adenopathy. Reproductive: Normal. Other: No free fluid or focal inflammatory change. Musculoskeletal: Degenerative change of the spine and hips. IMPRESSION: VASCULAR 1.  No evidence of acute GI bleed. 2. Aortic Atherosclerosis (ICD10-I70.0). No significant arterial stenosis or occlusion throughout the abdomen or pelvis. NON-VASCULAR 1.  No acute findings in the abdomen/pelvis. 2.  Single 8-9 mm gallstone. Electronically Signed   By: Marin Olp M.D.   On: 08/12/2019 16:43    Assessment;  Patient is 64 year old Caucasian male who presents with large volume painless hematochezia.  He is hemodynamically stable.  His hemoglobin is normal.  He has history of hemorrhoids and usually noticed small amount of blood and tissue once in a while.  However this was not a trickle.  He does have history of colonic adenomas and was supposed to have surveillance colonoscopy last year but did not. I suspect we are dealing with colonic diverticular bleed.  Patient will need diagnostic/surveillance colonoscopy.  Patient is agreeable to proceed with his exam.  Recommendations;  We will check INR with a.m. lab. Preparation patient with GoLYTELY for colonoscopy to be performed on 08/13/2019.   LOS: 0 days   Elihu Milstein  08/12/2019, 7:37 PM

## 2019-08-13 ENCOUNTER — Encounter (HOSPITAL_COMMUNITY)
Admission: EM | Disposition: A | Discharge status: Home / Self Care | Payer: Self-pay | Source: Home / Self Care | Attending: Emergency Medicine

## 2019-08-13 ENCOUNTER — Encounter (HOSPITAL_COMMUNITY): Payer: Self-pay | Admitting: Internal Medicine

## 2019-08-13 ENCOUNTER — Other Ambulatory Visit: Payer: Self-pay

## 2019-08-13 DIAGNOSIS — D123 Benign neoplasm of transverse colon: Secondary | ICD-10-CM

## 2019-08-13 DIAGNOSIS — K644 Residual hemorrhoidal skin tags: Secondary | ICD-10-CM

## 2019-08-13 DIAGNOSIS — K625 Hemorrhage of anus and rectum: Secondary | ICD-10-CM | POA: Diagnosis not present

## 2019-08-13 DIAGNOSIS — K5730 Diverticulosis of large intestine without perforation or abscess without bleeding: Secondary | ICD-10-CM

## 2019-08-13 DIAGNOSIS — Z720 Tobacco use: Secondary | ICD-10-CM | POA: Diagnosis not present

## 2019-08-13 DIAGNOSIS — D120 Benign neoplasm of cecum: Secondary | ICD-10-CM

## 2019-08-13 DIAGNOSIS — K921 Melena: Secondary | ICD-10-CM | POA: Diagnosis not present

## 2019-08-13 DIAGNOSIS — D122 Benign neoplasm of ascending colon: Secondary | ICD-10-CM

## 2019-08-13 HISTORY — PX: COLONOSCOPY: SHX5424

## 2019-08-13 HISTORY — PX: POLYPECTOMY: SHX5525

## 2019-08-13 HISTORY — PX: BIOPSY: SHX5522

## 2019-08-13 LAB — CBC
HCT: 48.3 % (ref 39.0–52.0)
Hemoglobin: 16.9 g/dL (ref 13.0–17.0)
MCH: 33.7 pg (ref 26.0–34.0)
MCHC: 35 g/dL (ref 30.0–36.0)
MCV: 96.4 fL (ref 80.0–100.0)
Platelets: 184 10*3/uL (ref 150–400)
RBC: 5.01 MIL/uL (ref 4.22–5.81)
RDW: 11.9 % (ref 11.5–15.5)
WBC: 6.7 10*3/uL (ref 4.0–10.5)
nRBC: 0 % (ref 0.0–0.2)

## 2019-08-13 LAB — BASIC METABOLIC PANEL
Anion gap: 8 (ref 5–15)
BUN: 13 mg/dL (ref 8–23)
CO2: 28 mmol/L (ref 22–32)
Calcium: 8.8 mg/dL — ABNORMAL LOW (ref 8.9–10.3)
Chloride: 100 mmol/L (ref 98–111)
Creatinine, Ser: 0.8 mg/dL (ref 0.61–1.24)
GFR calc Af Amer: >60 mL/min (ref >60)
GFR calc non Af Amer: >60 mL/min (ref >60)
Glucose, Bld: 95 mg/dL (ref 70–99)
Potassium: 5.1 mmol/L (ref 3.5–5.1)
Sodium: 136 mmol/L (ref 135–145)

## 2019-08-13 LAB — PROTIME-INR
INR: 1 (ref 0.8–1.2)
Prothrombin Time: 13.2 seconds (ref 11.4–15.2)

## 2019-08-13 SURGERY — COLONOSCOPY
Anesthesia: Moderate Sedation

## 2019-08-13 MED ORDER — HYDROCORTISONE ACETATE 25 MG RE SUPP: 25.0000 mg | Freq: Every day | RECTAL | 0 refills | Status: DC

## 2019-08-13 MED ORDER — MEPERIDINE HCL 50 MG/ML IJ SOLN
INTRAMUSCULAR | Status: DC | PRN
  Administered 2019-08-13 (×2): 25 mg

## 2019-08-13 MED ORDER — STERILE WATER FOR IRRIGATION IR SOLN: Status: DC | PRN

## 2019-08-13 MED ORDER — PSYLLIUM 95 % PO PACK: 1.0000 | PACK | Freq: Every day | ORAL | Status: DC

## 2019-08-13 MED ORDER — HYDROCORTISONE ACETATE 25 MG RE SUPP: 25.0000 mg | Freq: Every day | RECTAL | Status: DC

## 2019-08-13 MED ORDER — SODIUM CHLORIDE 0.9 % IV SOLN: INTRAVENOUS | Status: DC

## 2019-08-13 MED ORDER — MIDAZOLAM HCL 5 MG/5ML IJ SOLN
INTRAMUSCULAR | Status: DC | PRN
  Administered 2019-08-13 (×3): 2 mg via INTRAVENOUS

## 2019-08-13 MED ORDER — MIDAZOLAM HCL 5 MG/5ML IJ SOLN
INTRAMUSCULAR | Status: AC
  Filled 2019-08-13: qty 10

## 2019-08-13 MED ORDER — MEPERIDINE HCL 50 MG/ML IJ SOLN
INTRAMUSCULAR | Status: AC
  Filled 2019-08-13: qty 1

## 2019-08-13 NOTE — Progress Notes (Signed)
Nsg Discharge Note  Admit Date:  08/12/2019 Discharge date: 08/13/2019   Robert Montoya to be D/C'd home per MD order.  AVS completed.  Copy for chart, and copy for patient signed, and dated. Patient/caregiver able to verbalize understanding.  Discharge Medication: Allergies as of 08/13/2019   No Known Allergies     Medication List    TAKE these medications   hydrocortisone 25 MG suppository Commonly known as: ANUSOL-HC Place 1 suppository (25 mg total) rectally at bedtime.   lisinopril 20 MG tablet Commonly known as: ZESTRIL Take 20 mg by mouth daily.   omeprazole 20 MG capsule Commonly known as: PRILOSEC Take 20 mg by mouth daily.   pantoprazole 40 MG tablet Commonly known as: PROTONIX Take 40 mg by mouth daily as needed (indigestion).   psyllium 95 % Pack Commonly known as: HYDROCIL/METAMUCIL Take 1 packet by mouth at bedtime.       Discharge Assessment: Vitals:   08/13/19 0855 08/13/19 1113  BP: (!) 101/59 112/68  Pulse: 63   Resp: 12   Temp:    SpO2: 98%    Skin clean, dry and intact without evidence of skin break down, no evidence of skin tears noted. IV catheter discontinued intact. Site without signs and symptoms of complications - no redness or edema noted at insertion site, patient denies c/o pain - only slight tenderness at site.  Dressing with slight pressure applied.  D/c Instructions-Education: Discharge instructions given to patient/family with verbalized understanding. D/c education completed with patient/family including follow up instructions, medication list, d/c activities limitations if indicated, with other d/c instructions as indicated by MD - patient able to verbalize understanding, all questions fully answered. Patient instructed to return to ED, call 911, or call MD for any changes in condition.  Patient escorted via [place], and D/C home via private auto.  Robert Conch, RN 08/13/2019 12:04 PM

## 2019-08-13 NOTE — Progress Notes (Signed)
Brief colonoscopy report  Prep excellent. 3 small polyps removed and submitted together.  Cecal polyp ablated via cold biopsy and the 2 other polyps were cold snared. Few small diverticuli at sigmoid colon without stigmata of bleeding. Moderate external hemorrhoids with single erosion at dentate line.  No active bleeding.

## 2019-08-13 NOTE — Progress Notes (Signed)
Patient states he was able to take prep without any difficulty.  Last few times he had a bowel movement was clear watery.  He denies abdominal pain.  He says he has noted slight discomfort at the anal opening of the left side when his bowels move.  He states he has had this pain off and on for a while.

## 2019-08-13 NOTE — Discharge Summary (Signed)
Physician Discharge Summary  Robert Montoya [id number] DOB: Sep 01, 1955 DOA: 08/12/2019  PCP: Marjean Donna, MD (Inactive)  Admit date: 08/12/2019 Discharge date: 08/13/2019  Admitted From: Home Disposition:  Home   Recommendations for Outpatient Follow-up:  1. Follow up with PCP in 1-2 weeks 2. Please obtain BMP/CBC in one week    Discharge Condition: Stable CODE STATUS: FULL Diet recommendation: Heart Healthy    Brief/Interim Summary: 64 y.o. male with medical history of hypertension and tobacco abuse presenting with hematochezia that started on 08/11/2019.  The patient states that he has had some intermittent hematochezia that he has noted on toilet paper intermittently for the past several months.  He relates it to when he usually takes his wife's Naprosyn for arthritis pain.  He denies any other NSAIDs.  He denies any alcohol or illicit drug use.  He states that he took his wife's Naprosyn on 08/10/2019 and 1421.  He denies any fevers, chills, chest pain, shortness breath, dizziness, abdominal pain, nausea, vomiting, diarrhea, melanotic stool, abdominal pain.  There is no dysuria or hematuria.  Notably, the patient had a colonoscopy performed on 12/05/2013 which showed a small cecal polyp as well as a polyp at the junction of the descending and sigmoid colon.  There were 2 small diverticula in the sigmoid colon at that time and small hemorrhoids. In the emergency department, the patient is afebrile and hemodynamically stable with oxygen saturation 98% room air.  BMP and CBC were unremarkable with hemoglobin 16.5.  EKG shows sinus rhythm without ST or T wave changes.  FOBT was positive with bright red stool.  GI was consulted to assist with management.  Discharge Diagnoses:  Hematochezia -Suspect diverticular versus hemorrhoidal bleed -Appreciate GI consult -Colonoscopy 08/13/2019--- One diminutive polyp in the cecum. - Two small polyps at the splenic flexure and in the transverse colon  - Diverticulosis in the sigmoid colon. - External hemorrhoids with single non-bleeding erosion -advanced diet which pt tolerated -IV fluids -Anusol-HC at hs -increase fiber  Essential hypertension -Continue lisinopril  Tobacco abuse -Tobacco cessation discussed  Discharge Instructions   Allergies as of 08/13/2019   No Known Allergies     Medication List    TAKE these medications   hydrocortisone 25 MG suppository Commonly known as: ANUSOL-HC Place 1 suppository (25 mg total) rectally at bedtime.   lisinopril 20 MG tablet Commonly known as: ZESTRIL Take 20 mg by mouth daily.   omeprazole 20 MG capsule Commonly known as: PRILOSEC Take 20 mg by mouth daily.   pantoprazole 40 MG tablet Commonly known as: PROTONIX Take 40 mg by mouth daily as needed (indigestion).   psyllium 95 % Pack Commonly known as: HYDROCIL/METAMUCIL Take 1 packet by mouth at bedtime.       No Known Allergies  Consultations:  GI   Procedures/Studies: DG Chest Port 1 View  Result Date: 08/12/2019 CLINICAL DATA:  64 year old male with rectal bleeding. EXAM: PORTABLE CHEST 1 VIEW COMPARISON:  Chest radiograph dated 03/13/2016. FINDINGS: Minimal bibasilar atelectasis. There is no focal consolidation, pleural effusion or pneumothorax. The cardiac silhouette is within normal limits. No acute osseous pathology. Atherosclerotic calcification of the aorta. IMPRESSION: No active disease. Electronically Signed   By: Anner Crete M.D.   On: 08/12/2019 15:14   CT Angio Abd/Pel W and/or Wo Contrast  Result Date: 08/12/2019 CLINICAL DATA:  Rectal bleeding beginning this morning. EXAM: CTA ABDOMEN AND PELVIS WITHOUT AND WITH CONTRAST TECHNIQUE: Multidetector CT imaging of the abdomen and pelvis was performed using  the standard protocol during bolus administration of intravenous contrast. Multiplanar reconstructed images and MIPs were obtained and reviewed to evaluate the vascular anatomy. CONTRAST:   144mL OMNIPAQUE IOHEXOL 350 MG/ML SOLN COMPARISON:  02/12/2004 FINDINGS: VASCULAR Aorta: Normal in caliber without aneurysm or dissection. Mild-to-moderate calcified plaque throughout the abdominal aorta most notable distally. Celiac: Normal. SMA: Normal. Renals: Single renal arteries are patent bilaterally without stenosis. Minimal calcified plaque over the origin. IMA: Normal. Inflow: Calcified plaque over the common iliac arteries and femoral arteries. No evidence of focal stenosis or occlusion. Calcified plaque over the internal iliac arteries which are otherwise patent. Proximal Outflow: Normal. Veins: Unremarkable. Review of the MIP images confirms the above findings. NON-VASCULAR Lower chest: Lung bases are normal. Hepatobiliary: Liver and biliary tree are normal. Single 8-9 mm gallstone. Pancreas: Normal. Spleen: Normal. Adrenals/Urinary Tract: Adrenal glands are normal. Kidneys are normal in size without hydronephrosis or focal mass. Ureters and bladder are normal. Stomach/Bowel: Stomach and small bowel are normal. Appendix is normal. Colon is normal. Lymphatic: No adenopathy. Reproductive: Normal. Other: No free fluid or focal inflammatory change. Musculoskeletal: Degenerative change of the spine and hips. IMPRESSION: VASCULAR 1.  No evidence of acute GI bleed. 2. Aortic Atherosclerosis (ICD10-I70.0). No significant arterial stenosis or occlusion throughout the abdomen or pelvis. NON-VASCULAR 1.  No acute findings in the abdomen/pelvis. 2.  Single 8-9 mm gallstone. Electronically Signed   By: Marin Olp M.D.   On: 08/12/2019 16:43        Discharge Exam: Vitals:   08/13/19 0855 08/13/19 1113  BP: (!) 101/59 112/68  Pulse: 63   Resp: 12   Temp:    SpO2: 98%    Vitals:   08/13/19 0845 08/13/19 0850 08/13/19 0855 08/13/19 1113  BP: (!) 93/50 (!) 73/48 (!) 101/59 112/68  Pulse: 68 65 63   Resp: 12 12 12    Temp:      TempSrc:      SpO2: 96% 97% 98%   Weight:      Height:         General: Pt is alert, awake, not in acute distress Cardiovascular: RRR, S1/S2 +, no rubs, no gallops Respiratory: CTA bilaterally, no wheezing, no rhonchi Abdominal: Soft, NT, ND, bowel sounds + Extremities: no edema, no cyanosis   The results of significant diagnostics from this hospitalization (including imaging, microbiology, ancillary and laboratory) are listed below for reference.    Significant Diagnostic Studies: DG Chest Port 1 View  Result Date: 08/12/2019 CLINICAL DATA:  64 year old male with rectal bleeding. EXAM: PORTABLE CHEST 1 VIEW COMPARISON:  Chest radiograph dated 03/13/2016. FINDINGS: Minimal bibasilar atelectasis. There is no focal consolidation, pleural effusion or pneumothorax. The cardiac silhouette is within normal limits. No acute osseous pathology. Atherosclerotic calcification of the aorta. IMPRESSION: No active disease. Electronically Signed   By: Anner Crete M.D.   On: 08/12/2019 15:14   CT Angio Abd/Pel W and/or Wo Contrast  Result Date: 08/12/2019 CLINICAL DATA:  Rectal bleeding beginning this morning. EXAM: CTA ABDOMEN AND PELVIS WITHOUT AND WITH CONTRAST TECHNIQUE: Multidetector CT imaging of the abdomen and pelvis was performed using the standard protocol during bolus administration of intravenous contrast. Multiplanar reconstructed images and MIPs were obtained and reviewed to evaluate the vascular anatomy. CONTRAST:  131mL OMNIPAQUE IOHEXOL 350 MG/ML SOLN COMPARISON:  02/12/2004 FINDINGS: VASCULAR Aorta: Normal in caliber without aneurysm or dissection. Mild-to-moderate calcified plaque throughout the abdominal aorta most notable distally. Celiac: Normal. SMA: Normal. Renals: Single renal arteries are patent bilaterally  without stenosis. Minimal calcified plaque over the origin. IMA: Normal. Inflow: Calcified plaque over the common iliac arteries and femoral arteries. No evidence of focal stenosis or occlusion. Calcified plaque over the internal iliac  arteries which are otherwise patent. Proximal Outflow: Normal. Veins: Unremarkable. Review of the MIP images confirms the above findings. NON-VASCULAR Lower chest: Lung bases are normal. Hepatobiliary: Liver and biliary tree are normal. Single 8-9 mm gallstone. Pancreas: Normal. Spleen: Normal. Adrenals/Urinary Tract: Adrenal glands are normal. Kidneys are normal in size without hydronephrosis or focal mass. Ureters and bladder are normal. Stomach/Bowel: Stomach and small bowel are normal. Appendix is normal. Colon is normal. Lymphatic: No adenopathy. Reproductive: Normal. Other: No free fluid or focal inflammatory change. Musculoskeletal: Degenerative change of the spine and hips. IMPRESSION: VASCULAR 1.  No evidence of acute GI bleed. 2. Aortic Atherosclerosis (ICD10-I70.0). No significant arterial stenosis or occlusion throughout the abdomen or pelvis. NON-VASCULAR 1.  No acute findings in the abdomen/pelvis. 2.  Single 8-9 mm gallstone. Electronically Signed   By: Marin Olp M.D.   On: 08/12/2019 16:43     Microbiology: Recent Results (from the past 240 hour(s))  Respiratory Panel by RT PCR (Flu A&B, Covid) - Nasopharyngeal Swab     Status: None   Collection Time: 08/12/19  3:03 PM   Specimen: Nasopharyngeal Swab  Result Value Ref Range Status   SARS Coronavirus 2 by RT PCR NEGATIVE NEGATIVE Final    Comment: (NOTE) SARS-CoV-2 target nucleic acids are NOT DETECTED. The SARS-CoV-2 RNA is generally detectable in upper respiratoy specimens during the acute phase of infection. The lowest concentration of SARS-CoV-2 viral copies this assay can detect is 131 copies/mL. A negative result does not preclude SARS-Cov-2 infection and should not be used as the sole basis for treatment or other patient management decisions. A negative result may occur with  improper specimen collection/handling, submission of specimen other than nasopharyngeal swab, presence of viral mutation(s) within the areas  targeted by this assay, and inadequate number of viral copies (<131 copies/mL). A negative result must be combined with clinical observations, patient history, and epidemiological information. The expected result is Negative. Fact Sheet for Patients:  PinkCheek.be Fact Sheet for Healthcare Providers:  GravelBags.it This test is not yet ap proved or cleared by the [place] FDA and  has been authorized for detection and/or diagnosis of SARS-CoV-2 by FDA under an Emergency Use Authorization (EUA). This EUA will remain  in effect (meaning this test can be used) for the duration of the COVID-19 declaration under Section 564(b)(1) of the Act, 21 U.S.C. section 360bbb-3(b)(1), unless the authorization is terminated or revoked sooner.    Influenza A by PCR NEGATIVE NEGATIVE Final   Influenza B by PCR NEGATIVE NEGATIVE Final    Comment: (NOTE) The Xpert Xpress SARS-CoV-2/FLU/RSV assay is intended as an aid in  the diagnosis of influenza from Nasopharyngeal swab specimens and  should not be used as a sole basis for treatment. Nasal washings and  aspirates are unacceptable for Xpert Xpress SARS-CoV-2/FLU/RSV  testing. Fact Sheet for Patients: PinkCheek.be Fact Sheet for Healthcare Providers: GravelBags.it This test is not yet approved or cleared by the [place] FDA and  has been authorized for detection and/or diagnosis of SARS-CoV-2 by  FDA under an Emergency Use Authorization (EUA). This EUA will remain  in effect (meaning this test can be used) for the duration of the  Covid-19 declaration under Section 564(b)(1) of the Act, 21  U.S.C. section 360bbb-3(b)(1), unless the authorization is  terminated or revoked. Performed at [facility], [address], [place], [place] [address]      Labs: Basic Metabolic Panel: Recent Labs  Lab 08/12/19 1301 08/13/19 0424   NA 138 136  K 4.5 5.1  CL 104 100  CO2 26 28  GLUCOSE 106* 95  BUN 19 13  CREATININE 0.83 0.80  CALCIUM 9.0 8.8*   Liver Function Tests: Recent Labs  Lab 08/12/19 1301  AST 19  ALT 18  ALKPHOS 67  BILITOT 0.8  PROT 6.9  ALBUMIN 4.4   No results for input(s): LIPASE, AMYLASE in the last 168 hours. No results for input(s): AMMONIA in the last 168 hours. CBC: Recent Labs  Lab 08/12/19 1301 08/13/19 0424  WBC 8.2 6.7  HGB 16.5 16.9  HCT 47.1 48.3  MCV 95.5 96.4  PLT 181 184   Cardiac Enzymes: No results for input(s): CKTOTAL, CKMB, CKMBINDEX, TROPONINI in the last 168 hours. BNP: Invalid input(s): POCBNP CBG: No results for input(s): GLUCAP in the last 168 hours.  Time coordinating discharge:  36 minutes  Signed:  Orson Eva, DO [facility] Pager: [phone] 08/13/2019, 11:36 AM

## 2019-08-13 NOTE — Op Note (Addendum)
[facility] Patient Name: Robert Montoya Procedure Date: 08/13/2019 8:26 AM MRN: [id number] Date of Birth: 1956-05-03 Attending MD: Hildred Laser , MD CSN: [id number] Age: 64 Admit Type: Inpatient Procedure:                Colonoscopy Indications:              Rectal bleeding Providers:                Hildred Laser, MD, Janeece Riggers, RN, Nelma Rothman,                            Technician Referring MD:             Wynelle Fanny, DO Medicines:                Midazolam 6 mg IV, Meperidine 50 mg IV Complications:            No immediate complications. Estimated Blood Loss:     Estimated blood loss was minimal. Procedure:                Pre-Anesthesia Assessment:                           - Prior to the procedure, a History and Physical                            was performed, and patient medications and                            allergies were reviewed. The patient's tolerance of                            previous anesthesia was also reviewed. The risks                            and benefits of the procedure and the sedation                            options and risks were discussed with the patient.                            All questions were answered, and informed consent                            was obtained. Prior Anticoagulants: The patient has                            taken no previous anticoagulant or antiplatelet                            agents except for NSAID medication. ASA Grade                            Assessment: II - A patient with mild systemic  disease. After reviewing the risks and benefits,                            the patient was deemed in satisfactory condition to                            undergo the procedure.                           After obtaining informed consent, the colonoscope                            was passed under direct vision. Throughout the                            procedure, the patient's blood pressure,  pulse, and                            oxygen saturations were monitored continuously. The                            [id number] scope was introduced through                            the anus and advanced to the the cecum, identified                            by appendiceal orifice and ileocecal valve. The                            colonoscopy was performed without difficulty. The                            patient tolerated the procedure well. The quality                            of the bowel preparation was excellent. Anatomical                            landmarks were photographed. Scope In: 8:35:52 AM Scope Out: 9:01:10 AM Scope Withdrawal Time: 0 hours 15 minutes 59 seconds  Total Procedure Duration: 0 hours 25 minutes 18 seconds  Findings:      The perianal and digital rectal examinations were normal.      A diminutive polyp was found in the cecum. The polyp was sessile.       Biopsies were taken with a cold forceps for histology. The pathology       specimen was placed into Bottle Number 1.      Two polyps were found in the splenic flexure and transverse colon. The       polyps were small in size. These polyps were removed with a cold snare.       Resection and retrieval were complete. The pathology specimen was placed       into Bottle Number 1.      A few small-mouthed diverticula  were found in the sigmoid colon.      External hemorrhoids were found during retroflexion. The hemorrhoids       were medium-sized. Single erosion at dentate line. Impression:               - One diminutive polyp in the cecum. Biopsied.                           - Two small polyps at the splenic flexure and in                            the transverse colon, removed with a cold snare.                            Resected and retrieved.                           - Diverticulosis in the sigmoid colon.                           - External hemorrhoids with single non-bleeding                             erosiona t dentate line. Moderate Sedation:      Moderate (conscious) sedation was administered by the endoscopy nurse       and supervised by the endoscopist. The following parameters were       monitored: oxygen saturation, heart rate, blood pressure, CO2       capnography and response to care. Total physician intraservice time was       32 minutes. Recommendation:           - Patient has a contact number available for                            emergencies. The signs and symptoms of potential                            delayed complications were discussed with the                            patient. Return to normal activities tomorrow.                            Written discharge instructions were provided to the                            patient.                           - High fiber diet and low sodium diet today.                           - Continue present medications.                           - Metamucil 4 g po qhs.                           -  Anusol- HC supp one PR qhs for 2 weeks.                           - Await pathology results.                           - Return patient to hospital ward for possible                            discharge same day. Procedure Code(s):        --- Professional ---                           [phone], Colonoscopy, flexible; with removal of                            tumor(s), polyp(s), or other lesion(s) by snare                            technique                           45380, 59, Colonoscopy, flexible; with biopsy,                            single or multiple                           99153, Moderate sedation; each additional 15                            minutes intraservice time                           G0500, Moderate sedation services provided by the                            same physician or other qualified health care                            professional performing a gastrointestinal                             endoscopic service that sedation supports,                            requiring the presence of an independent trained                            observer to assist in the monitoring of the                            patient's level of consciousness and physiological                            status; initial 15 minutes of intra-service time;  patient age 15 years or older (additional time may                            be reported with [phone], as appropriate) Diagnosis Code(s):        --- Professional ---                           K63.5, Polyp of colon                           K64.4, Residual hemorrhoidal skin tags                           K62.5, Hemorrhage of anus and rectum                           K57.30, Diverticulosis of large intestine without                            perforation or abscess without bleeding CPT copyright 2019 [employer]. All rights reserved. The codes documented in this report are preliminary and upon coder review may  be revised to meet current compliance requirements. Hildred Laser, MD Hildred Laser, MD 08/13/2019 9:14:17 AM This report has been signed electronically. Number of Addenda: 0

## 2019-08-14 LAB — SURGICAL PATHOLOGY

## 2019-08-24 ENCOUNTER — Ambulatory Visit
Admission: EM | Admit: 2019-08-24 | Discharge: 2019-08-24 | Disposition: A | Discharge status: Home / Self Care | Payer: PPO | Attending: Emergency Medicine | Admitting: Emergency Medicine

## 2019-08-24 ENCOUNTER — Other Ambulatory Visit: Payer: Self-pay

## 2019-08-24 DIAGNOSIS — R062 Wheezing: Secondary | ICD-10-CM

## 2019-08-24 DIAGNOSIS — J208 Acute bronchitis due to other specified organisms: Secondary | ICD-10-CM

## 2019-08-24 MED ORDER — AZITHROMYCIN 250 MG PO TABS: 250.0000 mg | ORAL_TABLET | Freq: Every day | ORAL | 0 refills | Status: DC

## 2019-08-24 MED ORDER — PREDNISONE 10 MG (21) PO TBPK
ORAL_TABLET | ORAL | 0 refills | Status: DC
Start: 2019-08-24 — End: 2019-08-29

## 2019-08-24 NOTE — ED Provider Notes (Signed)
[facility]    CSN: [id number] Arrival date & time: 08/24/19  1546      History   Chief Complaint Chief Complaint  Patient presents with  . Cough    HPI Robert Montoya is a 64 y.o. male.   With history of recurrent bronchitis presented to the urgent care with a complaint of cough with yellowish green mucus, and nasal congestion for the past few days.  Denies sick exposure to COVID, flu or strep.  Denies recent travel.  Denies aggravating or alleviating symptoms.  Denies previous COVID infection.   Denies fever, chills, fatigue, rhinorrhea, sore throat, SOB, wheezing, chest pain, nausea, vomiting, changes in bowel or bladder habits.    The history is provided by the patient. No language interpreter was used.  Cough   Past Medical History:  Diagnosis Date  . GERD (gastroesophageal reflux disease)   . Hemorrhoids     Patient Active Problem List   Diagnosis Date Noted  . Hematochezia 08/12/2019  . Tobacco abuse 08/12/2019  . Rectal bleeding     Past Surgical History:  Procedure Laterality Date  . BIOPSY  08/13/2019   Procedure: BIOPSY;  Surgeon: Rogene Houston, MD;  [facility]: [facility];  Service: Endoscopy;;  . CARDIAC CATHETERIZATION    . COLONOSCOPY N/A 12/05/2013   Procedure: COLONOSCOPY;  Surgeon: Rogene Houston, MD;  [facility];  Service: Endoscopy;  Laterality: N/A;  1200  . COLONOSCOPY N/A 08/13/2019   Procedure: COLONOSCOPY;  Surgeon: Rogene Houston, MD;  [facility];  Service: Endoscopy;  Laterality: N/A;  . POLYPECTOMY  08/13/2019   Procedure: POLYPECTOMY;  Surgeon: Rogene Houston, MD;  [facility];  Service: Endoscopy;;       Home Medications    Prior to Admission medications   Medication Sig Start Date End Date Taking? Authorizing Provider  azithromycin (ZITHROMAX) 250 MG tablet Take 1 tablet (250 mg total) by mouth daily. Take first 2 tablets together, then 1 every day until finished. 08/24/19    Fayth Trefry, Darrelyn Hillock, FNP  hydrocortisone (ANUSOL-HC) 25 MG suppository Place 1 suppository (25 mg total) rectally at bedtime. 08/13/19   Orson Eva, MD  lisinopril (ZESTRIL) 20 MG tablet Take 20 mg by mouth daily. 05/28/19   [provider]  omeprazole (PRILOSEC) 20 MG capsule Take 20 mg by mouth daily. 05/10/19   [provider]  pantoprazole (PROTONIX) 40 MG tablet Take 40 mg by mouth daily as needed (indigestion).    [provider]  predniSONE (STERAPRED UNI-PAK 21 TAB) 10 MG (21) TBPK tablet Take 6 tabs by mouth daily  for 2 days, then 5 tabs for 2 days, then 4 tabs for 2 days, then 3 tabs for 2 days, 2 tabs for 2 days, then 1 tab by mouth daily for 2 days 08/24/19   Emerson Monte, FNP  psyllium (HYDROCIL/METAMUCIL) 95 % PACK Take 1 packet by mouth at bedtime. 08/13/19   Orson Eva, MD    Family History Family History  Problem Relation Age of Onset  . Colon cancer Maternal Uncle   . Colon cancer Paternal Aunt     Social History Social History   Tobacco Use  . Smoking status: Current Every Day Smoker    Packs/day: 0.25    Types: Cigarettes  . Smokeless tobacco: Never Used  Substance Use Topics  . Alcohol use: No  . Drug use: Never     Allergies   Patient has  no known allergies.   Review of Systems Review of Systems  Constitutional: Negative.   HENT: Positive for congestion.   Respiratory: Positive for cough.   Cardiovascular: Negative.   Gastrointestinal: Negative.   Neurological: Negative.   All other systems reviewed and are negative.    Physical Exam Triage Vital Signs ED Triage Vitals  Enc Vitals Group     BP      Pulse      Resp      Temp      Temp src      SpO2      Weight      Height      Head Circumference      Peak Flow      Pain Score      Pain Loc      Pain Edu?      Excl. in [place]?    No data found.  Updated Vital Signs BP 106/67   Pulse 99   Temp 98.3 F (36.8 C)   Resp 20   SpO2 94%   Visual  Acuity Right Eye Distance:   Left Eye Distance:   Bilateral Distance:    Right Eye Near:   Left Eye Near:    Bilateral Near:     Physical Exam Vitals and nursing note reviewed.  Constitutional:      General: He is not in acute distress.    Appearance: Normal appearance. He is normal weight. He is not ill-appearing, toxic-appearing or diaphoretic.  HENT:     Head: Normocephalic.     Right Ear: Tympanic membrane, ear canal and external ear normal. There is no impacted cerumen.     Left Ear: Tympanic membrane, ear canal and external ear normal. There is no impacted cerumen.     Nose: Nose normal. No congestion.     Mouth/Throat:     Mouth: Mucous membranes are moist.     Pharynx: Oropharynx is clear. No oropharyngeal exudate or posterior oropharyngeal erythema.  Cardiovascular:     Rate and Rhythm: Normal rate and regular rhythm.     Pulses: Normal pulses.     Heart sounds: Normal heart sounds. No murmur.  Pulmonary:     Effort: Pulmonary effort is normal. No respiratory distress.     Breath sounds: Wheezing present. No rhonchi.  Chest:     Chest wall: No tenderness.  Neurological:     Mental Status: He is alert and oriented to person, place, and time.      UC Treatments / Results  Labs (all labs ordered are listed, but only abnormal results are displayed) Labs Reviewed - No data to display  EKG   Radiology No results found.  Procedures Procedures (including critical care time)  Medications Ordered in UC Medications - No data to display  Initial Impression / Assessment and Plan / UC Course  I have reviewed the triage vital signs and the nursing notes.  Pertinent labs & imaging results that were available during my care of the patient were reviewed by me and considered in my medical decision making (see chart for details).    Patient is stable for discharge.  Azithromycin, prednisone were prescribed.  Was advised to follow-up PCP.  Return or go to ED for  worsening symptoms.  Final Clinical Impressions(s) / UC Diagnoses   Final diagnoses:  Acute bronchitis due to other specified organisms  Wheezing     Discharge Instructions     Take antibiotic as prescribed and to  completion Take prednisone as prescribed Follow-up with PCP Return or go to ED for new or worsening of symptoms.    ED Prescriptions    Medication Sig Dispense Auth. Provider   azithromycin (ZITHROMAX) 250 MG tablet Take 1 tablet (250 mg total) by mouth daily. Take first 2 tablets together, then 1 every day until finished. 6 tablet Devaunte Gasparini S, FNP   predniSONE (STERAPRED UNI-PAK 21 TAB) 10 MG (21) TBPK tablet Take 6 tabs by mouth daily  for 2 days, then 5 tabs for 2 days, then 4 tabs for 2 days, then 3 tabs for 2 days, 2 tabs for 2 days, then 1 tab by mouth daily for 2 days 42 tablet Collene Massimino, Darrelyn Hillock, FNP     PDMP not reviewed this encounter.   Emerson Monte, [place] 08/24/19 1616

## 2019-08-24 NOTE — ED Triage Notes (Signed)
Pt presents with c/o cough and nasal congestion. Pt has productive cough and reports h/o frequent bronchitis , has had both covid vaccines

## 2019-08-24 NOTE — Discharge Instructions (Addendum)
Take antibiotic as prescribed and to completion Take prednisone as prescribed Follow-up with PCP Return or go to ED for new or worsening of symptoms.

## 2019-08-27 ENCOUNTER — Other Ambulatory Visit (INDEPENDENT_AMBULATORY_CARE_PROVIDER_SITE_OTHER): Payer: Self-pay | Admitting: *Deleted

## 2019-08-27 DIAGNOSIS — K625 Hemorrhage of anus and rectum: Secondary | ICD-10-CM

## 2019-08-27 LAB — BASIC METABOLIC PANEL
BUN: 23 mg/dL (ref 7–25)
CO2: 30 mmol/L (ref 20–32)
Calcium: 9.7 mg/dL (ref 8.6–10.3)
Chloride: 102 mmol/L (ref 98–110)
Creat: 0.95 mg/dL (ref 0.70–1.25)
Glucose, Bld: 99 mg/dL (ref 65–99)
Potassium: 5.3 mmol/L (ref 3.5–5.3)
Sodium: 137 mmol/L (ref 135–146)

## 2019-08-27 LAB — CBC
HCT: 46.9 % (ref 38.5–50.0)
Hemoglobin: 16.5 g/dL (ref 13.2–17.1)
MCH: 33.8 pg — ABNORMAL HIGH (ref 27.0–33.0)
MCHC: 35.2 g/dL (ref 32.0–36.0)
MCV: 96.1 fL (ref 80.0–100.0)
MPV: 9.5 fL (ref 7.5–12.5)
Platelets: 202 10*3/uL (ref 140–400)
RBC: 4.88 10*6/uL (ref 4.20–5.80)
RDW: 12.5 % (ref 11.0–15.0)
WBC: 11.3 10*3/uL — ABNORMAL HIGH (ref 3.8–10.8)

## 2019-08-29 ENCOUNTER — Other Ambulatory Visit: Payer: Self-pay

## 2019-08-29 ENCOUNTER — Ambulatory Visit
Admission: EM | Admit: 2019-08-29 | Discharge: 2019-08-29 | Disposition: A | Discharge status: Home / Self Care | Payer: PPO | Attending: Family Medicine | Admitting: Family Medicine

## 2019-08-29 ENCOUNTER — Ambulatory Visit (INDEPENDENT_AMBULATORY_CARE_PROVIDER_SITE_OTHER): Payer: PPO

## 2019-08-29 DIAGNOSIS — J441 Chronic obstructive pulmonary disease with (acute) exacerbation: Secondary | ICD-10-CM | POA: Diagnosis not present

## 2019-08-29 DIAGNOSIS — R059 Cough, unspecified: Secondary | ICD-10-CM

## 2019-08-29 DIAGNOSIS — R05 Cough: Secondary | ICD-10-CM

## 2019-08-29 DIAGNOSIS — R0989 Other specified symptoms and signs involving the circulatory and respiratory systems: Secondary | ICD-10-CM | POA: Diagnosis not present

## 2019-08-29 DIAGNOSIS — Z20822 Contact with and (suspected) exposure to covid-19: Secondary | ICD-10-CM | POA: Diagnosis not present

## 2019-08-29 DIAGNOSIS — R0602 Shortness of breath: Secondary | ICD-10-CM

## 2019-08-29 DIAGNOSIS — K921 Melena: Secondary | ICD-10-CM | POA: Diagnosis not present

## 2019-08-29 DIAGNOSIS — F17200 Nicotine dependence, unspecified, uncomplicated: Secondary | ICD-10-CM

## 2019-08-29 MED ORDER — DEXAMETHASONE SODIUM PHOSPHATE 10 MG/ML IJ SOLN
10.0000 mg | Freq: Once | INTRAMUSCULAR | Status: AC
  Administered 2019-08-29: 09:00:00 10 mg via INTRAMUSCULAR

## 2019-08-29 MED ORDER — ALBUTEROL SULFATE HFA 108 (90 BASE) MCG/ACT IN AERS
2.0000 | INHALATION_SPRAY | Freq: Once | RESPIRATORY_TRACT | Status: AC
  Administered 2019-08-29: 09:00:00 2 via RESPIRATORY_TRACT

## 2019-08-29 MED ORDER — DOXYCYCLINE HYCLATE 100 MG PO CAPS
100.0000 mg | ORAL_CAPSULE | Freq: Two times a day (BID) | ORAL | 0 refills | Status: DC
Start: 2019-08-29 — End: 2019-11-29

## 2019-08-29 MED ORDER — PREDNISONE 10 MG (21) PO TBPK: ORAL_TABLET | Freq: Every day | ORAL | 0 refills | Status: AC

## 2019-08-29 MED ORDER — FLUTICASONE FUROATE-VILANTEROL 200-25 MCG/INH IN AEPB: 1.0000 | INHALATION_SPRAY | Freq: Every day | RESPIRATORY_TRACT | 0 refills | Status: DC

## 2019-08-29 NOTE — ED Provider Notes (Signed)
[facility]    CSN: [id number] Arrival date & time: 08/29/19  [id number]      History   Chief Complaint Chief Complaint  Patient presents with  . Shortness of Breath    HPI Robert Montoya is a 64 y.o. male.   Reports that he has been experiencing SOB, cough with white thick mucus, increased wheezing. He was seen here on 08/24/19 and was treated with azithromycin and a prednisone taper. He reports that he did feel better temporarily and when he finished his medication, it got worse again. Reports that he had diarrhea this morning as well x 1 episode. Reports that he took an antihistamine this morning hoping it would help. Denies ST, nausea, vomiting, rash, fever, other symptoms. Denies ever being diagnosed with COPD. Denies sick contacts. Has medical history significant for hematochezia and tobacco abuse. Reports that he has not had a cigarette in over a week.  ROS per HPI  The history is provided by the patient.    Past Medical History:  Diagnosis Date  . GERD (gastroesophageal reflux disease)   . Hemorrhoids     Patient Active Problem List   Diagnosis Date Noted  . Hematochezia 08/12/2019  . Tobacco abuse 08/12/2019  . Rectal bleeding     Past Surgical History:  Procedure Laterality Date  . BIOPSY  08/13/2019   Procedure: BIOPSY;  Surgeon: Rogene Houston, MD;  [facility];  Service: Endoscopy;;  . CARDIAC CATHETERIZATION    . COLONOSCOPY N/A 12/05/2013   Procedure: COLONOSCOPY;  Surgeon: Rogene Houston, MD;  [facility];  Service: Endoscopy;  Laterality: N/A;  1200  . COLONOSCOPY N/A 08/13/2019   Procedure: COLONOSCOPY;  Surgeon: Rogene Houston, MD;  [facility];  Service: Endoscopy;  Laterality: N/A;  . POLYPECTOMY  08/13/2019   Procedure: POLYPECTOMY;  Surgeon: Rogene Houston, MD;  [facility];  Service: Endoscopy;;       Home Medications    Prior to Admission medications   Medication Sig Start Date End  Date Taking? Authorizing Provider  azithromycin (ZITHROMAX) 250 MG tablet Take 1 tablet (250 mg total) by mouth daily. Take first 2 tablets together, then 1 every day until finished. 08/24/19   Avegno, Darrelyn Hillock, FNP  doxycycline (VIBRAMYCIN) 100 MG capsule Take 1 capsule (100 mg total) by mouth 2 (two) times daily. 08/29/19   Faustino Congress, NP  fluticasone furoate-vilanterol (BREO ELLIPTA) 200-25 MCG/INH AEPB Inhale 1 puff into the lungs daily. 08/29/19   Faustino Congress, NP  hydrocortisone (ANUSOL-HC) 25 MG suppository Place 1 suppository (25 mg total) rectally at bedtime. 08/13/19   Orson Eva, MD  lisinopril (ZESTRIL) 20 MG tablet Take 20 mg by mouth daily. 05/28/19   [provider]  omeprazole (PRILOSEC) 20 MG capsule Take 20 mg by mouth daily. 05/10/19   [provider]  pantoprazole (PROTONIX) 40 MG tablet Take 40 mg by mouth daily as needed (indigestion).    [provider]  predniSONE (STERAPRED UNI-PAK 21 TAB) 10 MG (21) TBPK tablet Take by mouth daily for 6 days. Take 6 tablets on day 1, 5 tablets on day 2, 4 tablets on day 3, 3 tablets on day 4, 2 tablets on day 5, 1 tablet on day 6 08/29/19 09/04/19  Faustino Congress, NP  psyllium (HYDROCIL/METAMUCIL) 95 % PACK Take 1 packet by mouth at bedtime. 08/13/19   Orson Eva, MD    Family History Family History  Problem Relation  Age of Onset  . Colon cancer Maternal Uncle   . Colon cancer Paternal Aunt     Social History Social History   Tobacco Use  . Smoking status: Current Every Day Smoker    Packs/day: 0.25    Types: Cigarettes  . Smokeless tobacco: Never Used  Substance Use Topics  . Alcohol use: No  . Drug use: Never     Allergies   Patient has no known allergies.   Review of Systems Review of Systems   Physical Exam Triage Vital Signs ED Triage Vitals  Enc Vitals Group     BP      Pulse      Resp      Temp      Temp src      SpO2      Weight      Height      Head  Circumference      Peak Flow      Pain Score      Pain Loc      Pain Edu?      Excl. in [place]?    No data found.  Updated Vital Signs BP (!) 144/82 (BP Location: Right Arm)   Pulse 73   Temp 97.7 F (36.5 C) (Oral)   Resp 16   SpO2 94%      Physical Exam Vitals and nursing note reviewed.  Constitutional:      General: He is not in acute distress.    Appearance: He is well-developed and normal weight. He is ill-appearing.  HENT:     Head: Normocephalic and atraumatic.     Mouth/Throat:     Mouth: Mucous membranes are moist.     Pharynx: Oropharynx is clear.  Eyes:     Extraocular Movements: Extraocular movements intact.     Conjunctiva/sclera: Conjunctivae normal.     Pupils: Pupils are equal, round, and reactive to light.  Cardiovascular:     Rate and Rhythm: Normal rate and regular rhythm.     Heart sounds: No murmur.  Pulmonary:     Effort: Pulmonary effort is normal. No tachypnea or respiratory distress.     Breath sounds: Examination of the right-upper field reveals wheezing and rhonchi. Examination of the left-upper field reveals wheezing and rhonchi. Examination of the right-middle field reveals wheezing and rhonchi. Examination of the left-middle field reveals wheezing and rhonchi. Examination of the right-lower field reveals wheezing and rhonchi. Examination of the left-lower field reveals wheezing and rhonchi. Wheezing and rhonchi present. No decreased breath sounds or rales.  Chest:     Chest wall: No mass, tenderness or edema. There is no dullness to percussion.  Abdominal:     Palpations: Abdomen is soft.     Tenderness: There is no abdominal tenderness.  Musculoskeletal:        General: Normal range of motion.     Cervical back: Normal range of motion and neck supple.  Lymphadenopathy:     Cervical: No cervical adenopathy.  Skin:    General: Skin is warm and dry.     Capillary Refill: Capillary refill takes less than 2 seconds.  Neurological:      General: No focal deficit present.     Mental Status: He is alert and oriented to person, place, and time.  Psychiatric:        Mood and Affect: Mood normal.        Behavior: Behavior normal.      UC Treatments / Results  Labs (all labs ordered are listed, but only abnormal results are displayed) Labs Reviewed  NOVEL CORONAVIRUS, NAA    EKG   Radiology DG Chest 2 View  Result Date: 08/29/2019 CLINICAL DATA:  Cough, congestion EXAM: CHEST - 2 VIEW COMPARISON:  08/12/2019 FINDINGS: Heart and mediastinal contours are within normal limits. No focal opacities or effusions. No acute bony abnormality. IMPRESSION: No active cardiopulmonary disease. Electronically Signed   By: Rolm Baptise M.D.   On: 08/29/2019 09:13    Procedures Procedures (including critical care time)  Medications Ordered in UC Medications  dexamethasone (DECADRON) injection 10 mg (10 mg Intramuscular Given 08/29/19 0907)  albuterol (VENTOLIN HFA) 108 (90 Base) MCG/ACT inhaler 2 puff (2 puffs Inhalation Given 08/29/19 0907)    Initial Impression / Assessment and Plan / UC Course  I have reviewed the triage vital signs and the nursing notes.  Pertinent labs & imaging results that were available during my care of the patient were reviewed by me and considered in my medical decision making (see chart for details).     Smoker Cough SOB: Presents with SOB that has been getting progressively worse over the last 2 weeks. Was seen in this office and treated with azithromycin and a prednisone taper. On exam diffuse wheezing and rhonchi noted bilaterally. No tachycardia or tachypnea noted on exam. No accessory muscle use. CXR negative for any active cardiopulmonary disease. Decadron 10mg  IM given in office today. Albuterol inhaler in office today, 2 puffs in office and every 4-6 hours as needed for SOB and wheezing. Sent in prescription for Breo once daily in the morning, as well as another round of a prednisone taper. Also  prescribed doxycycline 100mg  BID x 7 days. Instructed to follow up with primary care or with this office if he is not feeling better over the next 2-3 days. Instructed to go to the ER for trouble swallowing, trouble breathing, or other concerning symptoms. Covid swab obtained in office today.  Patient instructed to quarantine until results are back and negative.  If results are negative, patient may resume daily schedule as tolerated once they are fever free for 24 hours without the use of antipyretic medications.  If results are positive, patient instructed to quarantine 10 days from today.  Patient instructed to follow-up with primary care with this office as needed.  Patient instructed to follow-up in the ER for trouble swallowing, trouble breathing, other concerning symptoms.    Final Clinical Impressions(s) / UC Diagnoses   Final diagnoses:  SOB (shortness of breath)  Smoker  Cough     Discharge Instructions     I am going to go ahead and treat you like you have COPD.  You received a steroid injection in the office today, as well as an albuterol inhaler.   I have sent in a steroid inhaler for you to use once a day called Breo  I have also sent in another round of oral steroids for you to start tomorrow, as well as an antibiotic for you to take twice daily for the next 7 days.    ED Prescriptions    Medication Sig Dispense Auth. Provider   fluticasone furoate-vilanterol (BREO ELLIPTA) 200-25 MCG/INH AEPB Inhale 1 puff into the lungs daily. 60 each Faustino Congress, NP   doxycycline (VIBRAMYCIN) 100 MG capsule Take 1 capsule (100 mg total) by mouth 2 (two) times daily. 14 capsule Faustino Congress, NP   predniSONE (STERAPRED UNI-PAK 21 TAB) 10 MG (21) TBPK tablet Take by  mouth daily for 6 days. Take 6 tablets on day 1, 5 tablets on day 2, 4 tablets on day 3, 3 tablets on day 4, 2 tablets on day 5, 1 tablet on day 6 21 tablet Faustino Congress, NP     PDMP not reviewed this  encounter.   Faustino Congress, NP 08/29/19 [phone]

## 2019-08-29 NOTE — ED Triage Notes (Signed)
Triage by provider, see provider note

## 2019-08-29 NOTE — Discharge Instructions (Addendum)
I am going to go ahead and treat you like you have COPD.  You received a steroid injection in the office today, as well as an albuterol inhaler.   I have sent in a steroid inhaler for you to use once a day called Breo  I have also sent in another round of oral steroids for you to start tomorrow, as well as an antibiotic for you to take twice daily for the next 7 days.

## 2019-08-30 LAB — NOVEL CORONAVIRUS, NAA: SARS-CoV-2, NAA: NOT DETECTED

## 2019-08-30 LAB — SARS-COV-2, NAA 2 DAY TAT

## 2019-09-13 ENCOUNTER — Ambulatory Visit (INDEPENDENT_AMBULATORY_CARE_PROVIDER_SITE_OTHER): Payer: PPO | Admitting: Nurse Practitioner

## 2019-09-26 DIAGNOSIS — J449 Chronic obstructive pulmonary disease, unspecified: Secondary | ICD-10-CM | POA: Diagnosis not present

## 2019-09-26 DIAGNOSIS — Z0000 Encounter for general adult medical examination without abnormal findings: Secondary | ICD-10-CM | POA: Diagnosis not present

## 2019-09-26 DIAGNOSIS — K219 Gastro-esophageal reflux disease without esophagitis: Secondary | ICD-10-CM | POA: Diagnosis not present

## 2019-09-26 DIAGNOSIS — I10 Essential (primary) hypertension: Secondary | ICD-10-CM | POA: Diagnosis not present

## 2019-09-26 DIAGNOSIS — E785 Hyperlipidemia, unspecified: Secondary | ICD-10-CM | POA: Diagnosis not present

## 2019-10-05 ENCOUNTER — Ambulatory Visit: Payer: PPO | Admitting: Orthopedic Surgery

## 2019-10-05 ENCOUNTER — Other Ambulatory Visit: Payer: Self-pay

## 2019-10-05 ENCOUNTER — Encounter: Payer: Self-pay | Admitting: Orthopedic Surgery

## 2019-10-05 DIAGNOSIS — M542 Cervicalgia: Secondary | ICD-10-CM | POA: Diagnosis not present

## 2019-10-05 NOTE — Progress Notes (Signed)
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  He was referred to [place] by the [facility] clinic  Those notes indicate he has primary hypertension  They did the x-ray and it shows cervical disc disease at C5 and 6 he was referred here for definitive treatment  Review of systems neck pain heartburn otherwise negative does not smoke  He is inquiring about repeat epidural injection  Past Medical History:  Diagnosis Date   GERD (gastroesophageal reflux disease)    Hemorrhoids     Past Surgical History:  Procedure Laterality Date   BIOPSY  08/13/2019   Procedure: BIOPSY;  Surgeon: Rogene Houston, MD;  [facility];  Service: Endoscopy;;   CARDIAC CATHETERIZATION     COLONOSCOPY N/A 12/05/2013   Procedure: COLONOSCOPY;  Surgeon: Rogene Houston, MD;  [facility];  Service: Endoscopy;  Laterality: N/A;  1200   COLONOSCOPY N/A 08/13/2019   Procedure: COLONOSCOPY;  Surgeon: Rogene Houston, MD;  [facility];  Service: Endoscopy;  Laterality: N/A;   POLYPECTOMY  08/13/2019   Procedure: POLYPECTOMY;  Surgeon: Rogene Houston, MD;  [facility];  Service: Endoscopy;;    Family History  Problem Relation Age of Onset   Colon cancer Maternal Uncle    Colon cancer Paternal Aunt     Social History   Tobacco Use   Smoking status: Current Every Day Smoker    Packs/day: 0.25    Types: Cigarettes   Smokeless tobacco: Never Used  Substance Use Topics   Alcohol use: No   Drug use: Never    BP 134/77     Pulse 74    Ht 5\' 8"  (1.727 m)    Wt 220 lb 8 oz (100 kg)    BMI 33.53 kg/m   General appearance is normal Oriented x3 Mood affect normal Gait and station normal He is tender over the cervical spine he does have decreased range of motion and tenderness is at C5-C6 and also on the left side of the trap area.  His upper extremities show normal range of motion stability and strength bilaterally with normal skin bilaterally pulses and temperature normal bilateral lymph nodes negative bilaterally and sensation decreased on the left index finger and sort of an C6 area along the right deep tendon reflexes 2+ and equal  Outside x-ray does show C5-6 disc space narrowing  There is also facet arthritis  Report CLINICAL DATA:  Neck pain on left side radiating to left hand  EXAM: CERVICAL SPINE - 2-3 VIEW  COMPARISON:  07/01/2011  FINDINGS: Disc space narrowing and spurring at C5-6. Mild bilateral diffuse degenerative facet disease. Normal alignment. No fracture. Prevertebral soft tissues are normal.  IMPRESSION: Degenerative changes.  No acute bony abnormality.   Electronically Signed   By: Rolm Baptise M.D.   On: 07/02/2019 14:50   Recommend MRI cervical spine to get him ready to get an epidural injection we will call him with the result.

## 2019-10-05 NOTE — Patient Instructions (Signed)
Your MRI has been ordered.  We will contact your insurance company for approval. After the authorization is received you will get a call to schedule the MRI  After the MRI Dr Aline Brochure will call you with a report, we will then send you for the epidural steroid injections

## 2019-10-26 ENCOUNTER — Ambulatory Visit (HOSPITAL_COMMUNITY)
Admission: RE | Admit: 2019-10-26 | Discharge: 2019-10-26 | Disposition: A | Discharge status: Home / Self Care | Payer: PPO | Source: Ambulatory Visit | Attending: Orthopedic Surgery | Admitting: Orthopedic Surgery

## 2019-10-26 ENCOUNTER — Other Ambulatory Visit: Payer: Self-pay

## 2019-10-26 DIAGNOSIS — M542 Cervicalgia: Secondary | ICD-10-CM | POA: Diagnosis not present

## 2019-10-29 ENCOUNTER — Telehealth: Payer: Self-pay | Admitting: Orthopedic Surgery

## 2019-10-29 NOTE — Telephone Encounter (Signed)
Patient's results were given to him he has a new disc at C5-6 he has an old disc at C6-7 he says he is not having any weakness in his upper extremity so he wants to proceed with epidural injections go ahead and schedule he knows they will call him to set up a time

## 2019-10-29 NOTE — Telephone Encounter (Signed)
IMPRESSION: 1. Large chronic soft disc protrusion into the left lateral recess and proximal left neural foramen at C6-7 which should affect the left C7 nerve. This appears unchanged since the prior study. 2. Progressive disc protrusions with accompanying osteophytes at C5-6 extending to the right and left of midline, progressed on the left since the prior study. This likely affects the left C6 nerve and could affect the right C6 nerve.   Electronically Signed   By: Lorriane Shire M.D.   On: 10/27/2019 12:52

## 2019-10-30 ENCOUNTER — Telehealth: Payer: Self-pay | Admitting: Radiology

## 2019-10-30 DIAGNOSIS — M5020 Other cervical disc displacement, unspecified cervical region: Secondary | ICD-10-CM

## 2019-10-30 NOTE — Telephone Encounter (Signed)
-----   Message from Carole Civil, MD sent at 10/29/2019  3:25 PM EDT ----- Patient's results were given to him he has a new disc at C5-6 he has an old disc at C6-7 he says he is not having any weakness in his upper extremity so he wants to proceed with epidural injections go ahead and schedule he knows they will call him to set up a time

## 2019-11-29 ENCOUNTER — Encounter: Payer: Self-pay | Admitting: Physical Medicine and Rehabilitation

## 2019-11-29 ENCOUNTER — Other Ambulatory Visit: Payer: Self-pay

## 2019-11-29 ENCOUNTER — Ambulatory Visit: Payer: PPO | Admitting: Physical Medicine and Rehabilitation

## 2019-11-29 ENCOUNTER — Ambulatory Visit: Payer: Self-pay

## 2019-11-29 VITALS — BP 151/79 | HR 69

## 2019-11-29 DIAGNOSIS — M5010 Cervical disc disorder with radiculopathy, unspecified cervical region: Secondary | ICD-10-CM

## 2019-11-29 DIAGNOSIS — M5412 Radiculopathy, cervical region: Secondary | ICD-10-CM | POA: Diagnosis not present

## 2019-11-29 MED ORDER — METHYLPREDNISOLONE ACETATE 80 MG/ML IJ SUSP
80.0000 mg | Freq: Once | INTRAMUSCULAR | Status: AC
Start: 2019-11-29 — End: 2019-11-29
  Administered 2019-11-29: 80 mg

## 2019-11-29 NOTE — Procedures (Signed)
Cervical Epidural Steroid Injection - Interlaminar Approach with Fluoroscopic Guidance  Patient: Robert Montoya      Date of Birth: [birth year] MRN: [id number] PCP: Denyce Robert, FNP      Visit Date: 11/29/2019   Universal Protocol:    Date/Time: 07/29/219:27 AM  Consent Given By: the patient  Position: PRONE  Additional Comments: Vital signs were monitored before and after the procedure. Patient was prepped and draped in the usual sterile fashion. The correct patient, procedure, and site was verified.   Injection Procedure Details:  Procedure Site One Meds Administered:  Meds ordered this encounter  Medications  . methylPREDNISolone acetate (DEPO-MEDROL) injection 80 mg     Laterality: Left  Location/Site: C7-T1  Needle size: 20 G  Needle type: Touhy  Needle Placement: Paramedian epidural space  Findings:  -Comments: Excellent flow of contrast into the epidural space.  Procedure Details: Using a paramedian approach from the side mentioned above, the region overlying the inferior lamina was localized under fluoroscopic visualization and the soft tissues overlying this structure were infiltrated with 4 ml. of 1% Lidocaine without Epinephrine. A # 20 gauge, Tuohy needle was inserted into the epidural space using a paramedian approach.  The epidural space was localized using loss of resistance along with lateral and contralateral oblique bi-planar fluoroscopic views.  After negative aspirate for air, blood, and CSF, a 2 ml. volume of Isovue-250 was injected into the epidural space and the flow of contrast was observed. Radiographs were obtained for documentation purposes.   The injectate was administered into the level noted above.  Additional Comments:  The patient tolerated the procedure well Dressing: 2 x 2 sterile gauze and Band-Aid    Post-procedure details: Patient was observed during the procedure. Post-procedure instructions were reviewed.  Patient left  the clinic in stable condition.

## 2019-11-29 NOTE — Progress Notes (Signed)
Robert Montoya - 64 y.o. male MRN [id number]  Date of birth: [birth year]  Office Visit Note: Visit Date: 11/29/2019 PCP: Denyce Robert, FNP Referred by: Denyce Robert, FNP  Subjective: Chief Complaint  Patient presents with  . Left Shoulder - Numbness   HPI: Robert Montoya is a 64 y.o. male who comes in today at the request of Dr. Arther Abbott for planned Left C7-T1 Cervical epidural steroid injection with fluoroscopic guidance.  The patient has failed conservative care including home exercise, medications, time and activity modification.  This injection will be diagnostic and hopefully therapeutic.  Please see requesting physician notes for further details and justification.   ROS Otherwise per HPI.  Assessment & Plan: Visit Diagnoses:  1. Cervical radiculopathy   2. Cervical disc disorder with radiculopathy     Plan: No additional findings.   Meds & Orders:  Meds ordered this encounter  Medications  . methylPREDNISolone acetate (DEPO-MEDROL) injection 80 mg    Orders Placed This Encounter  Procedures  . XR C-ARM NO REPORT  . Epidural Steroid injection    Follow-up: Return in about 2 weeks (around 12/13/2019).   Procedures: No procedures performed  Cervical Epidural Steroid Injection - Interlaminar Approach with Fluoroscopic Guidance  Patient: Robert Montoya      Date of Birth: 1956-02-21 MRN: [id number] PCP: Denyce Robert, FNP      Visit Date: 11/29/2019   Universal Protocol:    Date/Time: 07/29/219:27 AM  Consent Given By: the patient  Position: PRONE  Additional Comments: Vital signs were monitored before and after the procedure. Patient was prepped and draped in the usual sterile fashion. The correct patient, procedure, and site was verified.   Injection Procedure Details:  Procedure Site One Meds Administered:  Meds ordered this encounter  Medications  . methylPREDNISolone acetate (DEPO-MEDROL) injection 80 mg     Laterality:  Left  Location/Site: C7-T1  Needle size: 20 G  Needle type: Touhy  Needle Placement: Paramedian epidural space  Findings:  -Comments: Excellent flow of contrast into the epidural space.  Procedure Details: Using a paramedian approach from the side mentioned above, the region overlying the inferior lamina was localized under fluoroscopic visualization and the soft tissues overlying this structure were infiltrated with 4 ml. of 1% Lidocaine without Epinephrine. A # 20 gauge, Tuohy needle was inserted into the epidural space using a paramedian approach.  The epidural space was localized using loss of resistance along with lateral and contralateral oblique bi-planar fluoroscopic views.  After negative aspirate for air, blood, and CSF, a 2 ml. volume of Isovue-250 was injected into the epidural space and the flow of contrast was observed. Radiographs were obtained for documentation purposes.   The injectate was administered into the level noted above.  Additional Comments:  The patient tolerated the procedure well Dressing: 2 x 2 sterile gauze and Band-Aid    Post-procedure details: Patient was observed during the procedure. Post-procedure instructions were reviewed.  Patient left the clinic in stable condition.     Clinical History: CLINICAL DATA:  Recent onset of neck pain and neck stiffness. Left arm pain. Numbness in the left index finger.  EXAM: MRI CERVICAL SPINE WITHOUT CONTRAST  TECHNIQUE: Multiplanar, multisequence MR imaging of the cervical spine was performed. No intravenous contrast was administered.  COMPARISON:  Cervical radiographs dated 07/02/2019 and cervical MRI dated 07/15/2011  FINDINGS: Alignment: Physiologic.  Vertebrae: No fracture, evidence of discitis, or bone lesion.  Cord: Normal signal and morphology.  Posterior Fossa, vertebral  arteries, paraspinal tissues: Negative.  Disc levels:  C2-3: Tiny central disc bulge with no neural  impingement. Minimal degenerative changes of the facet joints. No foraminal stenosis.  C3-4: Small central subligamentous central disc protrusion without focal neural impingement, slightly more prominent than on the prior study. Moderate right facet arthritis. No foraminal stenosis.  C4-5: Normal disc. Moderate right facet arthritis. Slight right foraminal narrowing, chronic.  C5-6: Disc protrusions with accompanying osteophytes extending to the right and left of midline, progressed on the left since the prior study. This likely affects the left C6 nerve and could affect the right C6 nerve. Slight bilateral facet arthritis. No foraminal stenosis.  C6-7: Large chronic soft disc protrusion into the left lateral recess and proximal left neural foramen which should affect the left C7 nerve. This appears unchanged since the prior study.  C7-T1: Normal disc.  Slight bilateral facet arthritis.  IMPRESSION: 1. Large chronic soft disc protrusion into the left lateral recess and proximal left neural foramen at C6-7 which should affect the left C7 nerve. This appears unchanged since the prior study. 2. Progressive disc protrusions with accompanying osteophytes at C5-6 extending to the right and left of midline, progressed on the left since the prior study. This likely affects the left C6 nerve and could affect the right C6 nerve.   Electronically Signed   By: Lorriane Shire M.D.   On: 10/27/2019 12:52   He reports that he has been smoking cigarettes. He has been smoking about 0.25 packs per day. He has never used smokeless tobacco. No results for input(s): HGBA1C, LABURIC in the last 8760 hours.  Objective:  VS:  HT:    WT:   BMI:     BP:(!) 151/79  HR:69bpm  TEMP: ( )  RESP:  Physical Exam Vitals and nursing note reviewed.  Constitutional:      General: He is not in acute distress.    Appearance: Normal appearance. He is not ill-appearing.  HENT:     Head:  Normocephalic and atraumatic.     Right Ear: External ear normal.     Left Ear: External ear normal.  Eyes:     Extraocular Movements: Extraocular movements intact.  Cardiovascular:     Rate and Rhythm: Normal rate.     Pulses: Normal pulses.  Abdominal:     General: There is no distension.     Palpations: Abdomen is soft.  Musculoskeletal:        General: No signs of injury.     Cervical back: Neck supple. Tenderness present. No rigidity.     Right lower leg: No edema.     Left lower leg: No edema.     Comments: Patient has good strength in the upper extremities with 5 out of 5 strength in wrist extension long finger flexion APB.  No intrinsic hand muscle atrophy.  Negative Hoffmann's test.  Lymphadenopathy:     Cervical: No cervical adenopathy.  Skin:    Findings: No erythema or rash.  Neurological:     General: No focal deficit present.     Mental Status: He is alert and oriented to person, place, and time.     Sensory: No sensory deficit.     Motor: No weakness or abnormal muscle tone.     Coordination: Coordination normal.  Psychiatric:        Mood and Affect: Mood normal.        Behavior: Behavior normal.     Ortho Exam  Imaging: No results  found.  Past Medical/Family/Surgical/Social History: Medications & Allergies reviewed per EMR, new medications updated. Patient Active Problem List   Diagnosis Date Noted  . Hematochezia 08/12/2019  . Tobacco abuse 08/12/2019  . Rectal bleeding    Past Medical History:  Diagnosis Date  . GERD (gastroesophageal reflux disease)   . Hemorrhoids    Family History  Problem Relation Age of Onset  . Colon cancer Maternal Uncle   . Colon cancer Paternal Aunt    Past Surgical History:  Procedure Laterality Date  . BIOPSY  08/13/2019   Procedure: BIOPSY;  Surgeon: Rogene Houston, MD;  Location: [facility];  Service: Endoscopy;;  . CARDIAC CATHETERIZATION    . COLONOSCOPY N/A 12/05/2013   Procedure: COLONOSCOPY;  Surgeon:  Rogene Houston, MD;  Location: [facility];  Service: Endoscopy;  Laterality: N/A;  1200  . COLONOSCOPY N/A 08/13/2019   Procedure: COLONOSCOPY;  Surgeon: Rogene Houston, MD;  Location: [facility];  Service: Endoscopy;  Laterality: N/A;  . POLYPECTOMY  08/13/2019   Procedure: POLYPECTOMY;  Surgeon: Rogene Houston, MD;  Location: [facility];  Service: Endoscopy;;   Social History   Occupational History  . Not on file  Tobacco Use  . Smoking status: Current Every Day Smoker    Packs/day: 0.25    Types: Cigarettes  . Smokeless tobacco: Never Used  Vaping Use  . Vaping Use: Never used  Substance and Sexual Activity  . Alcohol use: No  . Drug use: Never  . Sexual activity: Not on file

## 2019-11-29 NOTE — Progress Notes (Signed)
Pt states left shoulder that travels down his posterior left arm. Pt states it not a pain it a numbness that travels. Pt states laying down on his left side does help.   Numeric Pain Rating Scale and Functional Assessment Average Pain 7   In the last MONTH (on 0-10 scale) has pain interfered with the following?  1. General activity like being  able to carry out your everyday physical activities such as walking, climbing stairs, carrying groceries, or moving a chair?  Rating(4)   +Driver, -BT, -Dye Allergies.

## 2019-12-05 ENCOUNTER — Ambulatory Visit (INDEPENDENT_AMBULATORY_CARE_PROVIDER_SITE_OTHER): Payer: PPO | Admitting: Gastroenterology

## 2019-12-13 ENCOUNTER — Other Ambulatory Visit: Payer: Self-pay

## 2019-12-13 ENCOUNTER — Ambulatory Visit: Payer: PPO | Admitting: Physical Medicine and Rehabilitation

## 2019-12-13 ENCOUNTER — Ambulatory Visit: Payer: Self-pay

## 2019-12-13 ENCOUNTER — Encounter: Payer: Self-pay | Admitting: Physical Medicine and Rehabilitation

## 2019-12-13 VITALS — BP 134/78 | HR 70 | Ht 68.0 in | Wt 217.0 lb

## 2019-12-13 DIAGNOSIS — M5412 Radiculopathy, cervical region: Secondary | ICD-10-CM

## 2019-12-13 MED ORDER — METHYLPREDNISOLONE ACETATE 80 MG/ML IJ SUSP
80.0000 mg | Freq: Once | INTRAMUSCULAR | Status: AC
Start: 2019-12-13 — End: 2019-12-13
  Administered 2019-12-13: 80 mg

## 2019-12-13 NOTE — Progress Notes (Signed)
Right esi C7-T1, has uncomfortable feeling down shouders and down side. Wants to see if can do the injection on the right side. Numeric Pain Rating Scale and Functional Assessment Average Pain 4   In the last MONTH (on 0-10 scale) has pain interfered with the following?  1. General activity like being  able to carry out your everyday physical activities such as walking, climbing stairs, carrying groceries, or moving a chair?  Rating(4)   +Driver, -BT, -Dye Allergies.

## 2019-12-13 NOTE — Procedures (Signed)
Cervical Epidural Steroid Injection - Interlaminar Approach with Fluoroscopic Guidance  Patient: Robert Montoya      Date of Birth: Dec 08, 1955 MRN: [id number] PCP: Denyce Robert, FNP      Visit Date: 12/13/2019   Universal Protocol:    Date/Time: 12/13/2110:45 PM  Consent Given By: the patient  Position: PRONE  Additional Comments: Vital signs were monitored before and after the procedure. Patient was prepped and draped in the usual sterile fashion. The correct patient, procedure, and site was verified.   Injection Procedure Details:  Procedure Site One Meds Administered:  Meds ordered this encounter  Medications  . methylPREDNISolone acetate (DEPO-MEDROL) injection 80 mg     Laterality: Right  Location/Site: C7-T1  Needle size: 20 G  Needle type: Touhy  Needle Placement: Paramedian epidural space  Findings:  -Comments: Excellent flow of contrast into the epidural space.  Procedure Details: Using a paramedian approach from the side mentioned above, the region overlying the inferior lamina was localized under fluoroscopic visualization and the soft tissues overlying this structure were infiltrated with 4 ml. of 1% Lidocaine without Epinephrine. A # 20 gauge, Tuohy needle was inserted into the epidural space using a paramedian approach.  The epidural space was localized using loss of resistance along with lateral and contralateral oblique bi-planar fluoroscopic views.  After negative aspirate for air, blood, and CSF, a 2 ml. volume of Isovue-250 was injected into the epidural space and the flow of contrast was observed. Radiographs were obtained for documentation purposes.   The injectate was administered into the level noted above.  Additional Comments:  The patient tolerated the procedure well Dressing: 2 x 2 sterile gauze and Band-Aid    Post-procedure details: Patient was observed during the procedure. Post-procedure instructions were reviewed.  Patient  left the clinic in stable condition.

## 2019-12-13 NOTE — Progress Notes (Signed)
Robert Montoya - 64 y.o. male MRN [id number]  Date of birth: 1955-12-14  Office Visit Note: Visit Date: 12/13/2019 PCP: Denyce Robert, FNP Referred by: Denyce Robert, FNP  Subjective: No chief complaint on file.  HPI:  Robert Montoya is a 64 y.o. male who comes in today for planned repeat Right C7-T1 Cervical epidural steroid injection with fluoroscopic guidance.  The patient has failed conservative care including home exercise, medications, time and activity modification.  This injection will be diagnostic and hopefully therapeutic.  Please see requesting physician notes for further details and justification. Patient received more than 50% pain relief from prior injection.  He had more relief on the left side and it was a left-sided injection.  We will repeat injection with more biased to the right.  Depending on relief patient should continue with physical therapy with potential for dry needling as I do think he is having some myofascial pain.  Referring: Dr. Arther Abbott   ROS Otherwise per HPI.  Assessment & Plan: Visit Diagnoses:  1. Cervical radiculopathy     Plan: No additional findings.   Meds & Orders:  Meds ordered this encounter  Medications  . methylPREDNISolone acetate (DEPO-MEDROL) injection 80 mg    Orders Placed This Encounter  Procedures  . XR C-ARM NO REPORT  . Epidural Steroid injection    Follow-up: Return for visit to requesting physician as needed.   Procedures: No procedures performed  Cervical Epidural Steroid Injection - Interlaminar Approach with Fluoroscopic Guidance  Patient: Robert Montoya      Date of Birth: Apr 23, 1956 MRN: [id number] PCP: Denyce Robert, FNP      Visit Date: 12/13/2019   Universal Protocol:    Date/Time: 12/13/2110:45 PM  Consent Given By: the patient  Position: PRONE  Additional Comments: Vital signs were monitored before and after the procedure. Patient was prepped and draped in the usual sterile  fashion. The correct patient, procedure, and site was verified.   Injection Procedure Details:  Procedure Site One Meds Administered:  Meds ordered this encounter  Medications  . methylPREDNISolone acetate (DEPO-MEDROL) injection 80 mg     Laterality: Right  Location/Site: C7-T1  Needle size: 20 G  Needle type: Touhy  Needle Placement: Paramedian epidural space  Findings:  -Comments: Excellent flow of contrast into the epidural space.  Procedure Details: Using a paramedian approach from the side mentioned above, the region overlying the inferior lamina was localized under fluoroscopic visualization and the soft tissues overlying this structure were infiltrated with 4 ml. of 1% Lidocaine without Epinephrine. A # 20 gauge, Tuohy needle was inserted into the epidural space using a paramedian approach.  The epidural space was localized using loss of resistance along with lateral and contralateral oblique bi-planar fluoroscopic views.  After negative aspirate for air, blood, and CSF, a 2 ml. volume of Isovue-250 was injected into the epidural space and the flow of contrast was observed. Radiographs were obtained for documentation purposes.   The injectate was administered into the level noted above.  Additional Comments:  The patient tolerated the procedure well Dressing: 2 x 2 sterile gauze and Band-Aid    Post-procedure details: Patient was observed during the procedure. Post-procedure instructions were reviewed.  Patient left the clinic in stable condition.     Clinical History: CLINICAL DATA:  Recent onset of neck pain and neck stiffness. Left arm pain. Numbness in the left index finger.  EXAM: MRI CERVICAL SPINE WITHOUT CONTRAST  TECHNIQUE: Multiplanar, multisequence MR imaging of  the cervical spine was performed. No intravenous contrast was administered.  COMPARISON:  Cervical radiographs dated 07/02/2019 and cervical MRI dated  07/15/2011  FINDINGS: Alignment: Physiologic.  Vertebrae: No fracture, evidence of discitis, or bone lesion.  Cord: Normal signal and morphology.  Posterior Fossa, vertebral arteries, paraspinal tissues: Negative.  Disc levels:  C2-3: Tiny central disc bulge with no neural impingement. Minimal degenerative changes of the facet joints. No foraminal stenosis.  C3-4: Small central subligamentous central disc protrusion without focal neural impingement, slightly more prominent than on the prior study. Moderate right facet arthritis. No foraminal stenosis.  C4-5: Normal disc. Moderate right facet arthritis. Slight right foraminal narrowing, chronic.  C5-6: Disc protrusions with accompanying osteophytes extending to the right and left of midline, progressed on the left since the prior study. This likely affects the left C6 nerve and could affect the right C6 nerve. Slight bilateral facet arthritis. No foraminal stenosis.  C6-7: Large chronic soft disc protrusion into the left lateral recess and proximal left neural foramen which should affect the left C7 nerve. This appears unchanged since the prior study.  C7-T1: Normal disc.  Slight bilateral facet arthritis.  IMPRESSION: 1. Large chronic soft disc protrusion into the left lateral recess and proximal left neural foramen at C6-7 which should affect the left C7 nerve. This appears unchanged since the prior study. 2. Progressive disc protrusions with accompanying osteophytes at C5-6 extending to the right and left of midline, progressed on the left since the prior study. This likely affects the left C6 nerve and could affect the right C6 nerve.   Electronically Signed   By: Lorriane Shire M.D.   On: 10/27/2019 12:52     Objective:  VS:  HT:5\' 8"  (172.7 cm)   WT:217 lb (98.4 kg)  BMI:33    BP:134/78  HR:70bpm  TEMP: ( )  RESP:  Physical Exam Vitals and nursing note reviewed.  Constitutional:       General: He is not in acute distress.    Appearance: Normal appearance. He is not ill-appearing.  HENT:     Head: Normocephalic and atraumatic.     Right Ear: External ear normal.     Left Ear: External ear normal.  Eyes:     Extraocular Movements: Extraocular movements intact.  Cardiovascular:     Rate and Rhythm: Normal rate.     Pulses: Normal pulses.  Abdominal:     General: There is no distension.     Palpations: Abdomen is soft.  Musculoskeletal:        General: No signs of injury.     Cervical back: Neck supple. Tenderness present. No rigidity.     Right lower leg: No edema.     Left lower leg: No edema.     Comments: Patient has good strength in the upper extremities with 5 out of 5 strength in wrist extension long finger flexion APB.  No intrinsic hand muscle atrophy.  Negative Hoffmann's test.  Lymphadenopathy:     Cervical: No cervical adenopathy.  Skin:    Findings: No erythema or rash.  Neurological:     General: No focal deficit present.     Mental Status: He is alert and oriented to person, place, and time.     Sensory: No sensory deficit.     Motor: No weakness or abnormal muscle tone.     Coordination: Coordination normal.  Psychiatric:        Mood and Affect: Mood normal.  Behavior: Behavior normal.      Imaging: XR C-ARM NO REPORT  Result Date: 12/13/2019 Please see Notes tab for imaging impression.

## 2019-12-28 DIAGNOSIS — E785 Hyperlipidemia, unspecified: Secondary | ICD-10-CM | POA: Diagnosis not present

## 2019-12-28 DIAGNOSIS — E559 Vitamin D deficiency, unspecified: Secondary | ICD-10-CM | POA: Diagnosis not present

## 2019-12-28 DIAGNOSIS — Z79899 Other long term (current) drug therapy: Secondary | ICD-10-CM | POA: Diagnosis not present

## 2019-12-28 DIAGNOSIS — E781 Pure hyperglyceridemia: Secondary | ICD-10-CM | POA: Diagnosis not present

## 2019-12-28 DIAGNOSIS — I10 Essential (primary) hypertension: Secondary | ICD-10-CM | POA: Diagnosis not present

## 2019-12-31 DIAGNOSIS — E559 Vitamin D deficiency, unspecified: Secondary | ICD-10-CM | POA: Diagnosis not present

## 2019-12-31 DIAGNOSIS — Z79899 Other long term (current) drug therapy: Secondary | ICD-10-CM | POA: Diagnosis not present

## 2019-12-31 DIAGNOSIS — I10 Essential (primary) hypertension: Secondary | ICD-10-CM | POA: Diagnosis not present

## 2019-12-31 DIAGNOSIS — E785 Hyperlipidemia, unspecified: Secondary | ICD-10-CM | POA: Diagnosis not present

## 2020-02-28 DIAGNOSIS — R1010 Upper abdominal pain, unspecified: Secondary | ICD-10-CM | POA: Diagnosis not present

## 2020-02-28 DIAGNOSIS — R195 Other fecal abnormalities: Secondary | ICD-10-CM | POA: Diagnosis not present

## 2020-02-28 DIAGNOSIS — R143 Flatulence: Secondary | ICD-10-CM | POA: Diagnosis not present

## 2020-02-28 DIAGNOSIS — S76011A Strain of muscle, fascia and tendon of right hip, initial encounter: Secondary | ICD-10-CM | POA: Diagnosis not present

## 2020-02-28 DIAGNOSIS — K8020 Calculus of gallbladder without cholecystitis without obstruction: Secondary | ICD-10-CM | POA: Diagnosis not present

## 2020-03-10 ENCOUNTER — Ambulatory Visit: Payer: PPO | Admitting: Orthopedic Surgery

## 2020-03-10 ENCOUNTER — Other Ambulatory Visit: Payer: Self-pay

## 2020-03-10 ENCOUNTER — Encounter: Payer: Self-pay | Admitting: Orthopedic Surgery

## 2020-03-10 VITALS — BP 133/65 | HR 84 | Resp 16 | Ht 68.0 in | Wt 221.0 lb

## 2020-03-10 DIAGNOSIS — M65311 Trigger thumb, right thumb: Secondary | ICD-10-CM | POA: Diagnosis not present

## 2020-03-10 NOTE — Progress Notes (Signed)
Robert Montoya  03/10/2020  Body mass index is 33.6 kg/m.  MEDICAL DECISION SECTION:  Encounter Diagnosis  Name Primary?  . Trigger thumb of right hand Yes    Imaging No imaging  Plan:  (Rx., Inj., surg., Frx, MRI/CT, XR:2)  Inject right thumb  May repeat as needed up to 3 times  Right Trigger thumb injection Medication  1 mL of 40 mg Depo-Medrol  2 mL of 1% lidocaine plain  Ethyl chloride for anesthesia  Verbal consent was obtained timeout was taken to confirm the injection site as right thumb  Alcohol was used to prepare the skin along with ethyl chloride and then the injection was made at the A1 pulley there were no complications   HISTORY SECTION :  Chief Complaint  Patient presents with  . 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    Past Surgical History:  Procedure Laterality Date  . BIOPSY  08/13/2019   Procedure: BIOPSY;  Surgeon: Rogene Houston, MD;  Location: [facility];  Service: Endoscopy;;  . CARDIAC CATHETERIZATION    . COLONOSCOPY N/A 12/05/2013   Procedure: COLONOSCOPY;  Surgeon: Rogene Houston, MD;  Location: [facility];  Service: Endoscopy;  Laterality: N/A;  1200  . COLONOSCOPY N/A 08/13/2019   Procedure: COLONOSCOPY;  Surgeon: Rogene Houston, MD;  [facility]: [facility];  Service: Endoscopy;  Laterality: N/A;  . POLYPECTOMY  08/13/2019   Procedure: POLYPECTOMY;  Surgeon: Rogene Houston, MD;  Location: [facility];  Service: Endoscopy;;     Social History   Tobacco Use  . Smoking status: Former Smoker    Packs/day: 0.25    Types: Cigarettes  . Smokeless tobacco: Never Used  Vaping Use  . Vaping Use: Never  used  Substance Use Topics  . Alcohol use: No  . Drug use: Never       No Known Allergies   Current Outpatient Medications:  .  fluticasone furoate-vilanterol (BREO ELLIPTA) 200-25 MCG/INH AEPB, Inhale 1 puff into the lungs daily., Disp: 60 each, Rfl: 0 .  lisinopril (ZESTRIL) 20 MG tablet, Take 20 mg by mouth daily., Disp: , Rfl:  .  omeprazole (PRILOSEC) 20 MG capsule, Take 20 mg by mouth daily., Disp: , Rfl:  .  pantoprazole (PROTONIX) 40 MG tablet, Take 40 mg by mouth daily as needed (indigestion)., Disp: , Rfl:    PHYSICAL EXAM SECTION: BP 133/65   Pulse 84   Resp 16   Ht 5\' 8"  (1.727 m)   Wt 221 lb (100.2 kg)   BMI 33.60 kg/m   Body mass index is 33.6 kg/m.   General appearance: Well-developed well-nourished no gross deformities   Skin no lacerations or ulcerations no nodularity no palpable masses, no erythema or nodularity  Musculoskeletal: Right thumb popping clicking but the extensor tendon and flexor tendon is intact he has tenderness over A1 pulley consistent with triggering   2:42 PM

## 2020-04-04 DIAGNOSIS — E559 Vitamin D deficiency, unspecified: Secondary | ICD-10-CM | POA: Diagnosis not present

## 2020-04-04 DIAGNOSIS — I10 Essential (primary) hypertension: Secondary | ICD-10-CM | POA: Diagnosis not present

## 2020-04-04 DIAGNOSIS — E785 Hyperlipidemia, unspecified: Secondary | ICD-10-CM | POA: Diagnosis not present

## 2020-04-10 ENCOUNTER — Encounter (INDEPENDENT_AMBULATORY_CARE_PROVIDER_SITE_OTHER): Payer: Self-pay | Admitting: Gastroenterology

## 2020-04-10 DIAGNOSIS — J449 Chronic obstructive pulmonary disease, unspecified: Secondary | ICD-10-CM | POA: Diagnosis not present

## 2020-04-10 DIAGNOSIS — E559 Vitamin D deficiency, unspecified: Secondary | ICD-10-CM | POA: Diagnosis not present

## 2020-04-10 DIAGNOSIS — I10 Essential (primary) hypertension: Secondary | ICD-10-CM | POA: Diagnosis not present

## 2020-05-06 DIAGNOSIS — Z1152 Encounter for screening for COVID-19: Secondary | ICD-10-CM | POA: Diagnosis not present

## 2020-05-06 DIAGNOSIS — J441 Chronic obstructive pulmonary disease with (acute) exacerbation: Secondary | ICD-10-CM | POA: Diagnosis not present

## 2020-05-06 DIAGNOSIS — Z1159 Encounter for screening for other viral diseases: Secondary | ICD-10-CM | POA: Diagnosis not present

## 2020-05-06 DIAGNOSIS — J309 Allergic rhinitis, unspecified: Secondary | ICD-10-CM | POA: Diagnosis not present

## 2020-07-04 DIAGNOSIS — E785 Hyperlipidemia, unspecified: Secondary | ICD-10-CM | POA: Diagnosis not present

## 2020-07-04 DIAGNOSIS — E559 Vitamin D deficiency, unspecified: Secondary | ICD-10-CM | POA: Diagnosis not present

## 2020-07-04 DIAGNOSIS — I10 Essential (primary) hypertension: Secondary | ICD-10-CM | POA: Diagnosis not present

## 2020-07-10 DIAGNOSIS — E785 Hyperlipidemia, unspecified: Secondary | ICD-10-CM | POA: Diagnosis not present

## 2020-07-10 DIAGNOSIS — E559 Vitamin D deficiency, unspecified: Secondary | ICD-10-CM | POA: Diagnosis not present

## 2020-07-10 DIAGNOSIS — I10 Essential (primary) hypertension: Secondary | ICD-10-CM | POA: Diagnosis not present

## 2020-07-17 ENCOUNTER — Other Ambulatory Visit: Payer: Self-pay

## 2020-07-17 ENCOUNTER — Ambulatory Visit (INDEPENDENT_AMBULATORY_CARE_PROVIDER_SITE_OTHER): Payer: PPO | Admitting: Gastroenterology

## 2020-07-17 ENCOUNTER — Encounter (INDEPENDENT_AMBULATORY_CARE_PROVIDER_SITE_OTHER): Payer: Self-pay | Admitting: Gastroenterology

## 2020-07-17 DIAGNOSIS — K8020 Calculus of gallbladder without cholecystitis without obstruction: Secondary | ICD-10-CM | POA: Diagnosis not present

## 2020-07-17 DIAGNOSIS — R1011 Right upper quadrant pain: Secondary | ICD-10-CM | POA: Diagnosis not present

## 2020-07-17 NOTE — Progress Notes (Signed)
[name], M.D. Gastroenterology & Hepatology [facility] [address] [place],  [address]  Primary Care Physician: Denyce Robert, [place] [address]  I will communicate my assessment and recommendations to the referring MD via EMR.  Problems: 1. RUQ abdominal pain, possible biliary colic 2. Choledocholithiasis 3. History of diverticular bleeding  History of Present Illness: Robert Montoya is a 65 y.o. male past medical history of choledocholithiasis and GERD, who presents for evaluation of right upper quadrant pain.  Patient reports that 5 months ago he presented an episode of RUQ abdominal pain, which lasted for less than 2 days. He has only presented this pain once.  He did not take any pain medication for it and it resolved on its own.  He denies having any associated symptoms at that time such as nausea or vomiting.  He reports having chronic constipation - he has a BM every 1-2 days for which he takes stool softeners as needed.  The patient denies having any nausea, vomiting, fever, chills, hematochezia, melena, hematemesis, abdominal distention, diarrhea, jaundice, pruritus or weight loss.  Most recent abdominal imaging was performed on 08/12/2019 which was a CT angio of the abdomen which showed normal vasculature and 9 mm gallstone.  Last EGD: never Last Colonoscopy: 4/ 2021 - One diminutive polyp in the cecum. Path TA. - Two small polyps at the splenic flexure and in the transverse colon, removed with a cold snare. Resected and retrieved. Path TA. - Diverticulosis in the sigmoid colon. - External hemorrhoids with single non-bleeding erosiona t dentate line.  Past Medical History: Past Medical History:  Diagnosis Date  . GERD (gastroesophageal reflux disease)   . Hemorrhoids     Past Surgical History: Past Surgical History:  Procedure Laterality Date  . BIOPSY  08/13/2019    Procedure: BIOPSY;  Surgeon: Rogene Houston, MD;  [facility];  Service: Endoscopy;;  . CARDIAC CATHETERIZATION    . COLONOSCOPY N/A 12/05/2013   Procedure: COLONOSCOPY;  Surgeon: Rogene Houston, MD;  [facility];  Service: Endoscopy;  Laterality: N/A;  1200  . COLONOSCOPY N/A 08/13/2019   Procedure: COLONOSCOPY;  Surgeon: Rogene Houston, MD;  [facility];  Service: Endoscopy;  Laterality: N/A;  . POLYPECTOMY  08/13/2019   Procedure: POLYPECTOMY;  Surgeon: Rogene Houston, MD;  [facility];  Service: Endoscopy;;    Family History: Family History  Problem Relation Age of Onset  . Colon cancer Maternal Uncle   . Colon cancer Paternal Aunt     Social History: Social History   Tobacco Use  Smoking Status Current Every Day Smoker  . Packs/day: 0.25  . Types: Cigarettes  Smokeless Tobacco Never Used   Social History   Substance and Sexual Activity  Alcohol Use No   Social History   Substance and Sexual Activity  Drug Use Never    Allergies: No Known Allergies  Medications: Current Outpatient Medications  Medication Sig Dispense Refill  . fluticasone furoate-vilanterol (BREO ELLIPTA) 200-25 MCG/INH AEPB Inhale 1 puff into the lungs daily. 60 each 0  . lisinopril (ZESTRIL) 20 MG tablet Take 20 mg by mouth daily.    Marland Kitchen omeprazole (PRILOSEC) 20 MG capsule Take 20 mg by mouth as needed.     No current facility-administered medications for this visit.    Review of Systems: GENERAL: negative for malaise, night sweats HEENT: No changes in hearing or  vision, no nose bleeds or other nasal problems. NECK: Negative for lumps, goiter, pain and significant neck swelling RESPIRATORY: Negative for cough, wheezing CARDIOVASCULAR: Negative for chest pain, leg swelling, palpitations, orthopnea GI: SEE HPI MUSCULOSKELETAL: Negative for joint pain or swelling, back pain, and muscle pain. SKIN: Negative for lesions, rash PSYCH:  Negative for sleep disturbance, mood disorder and recent psychosocial stressors. HEMATOLOGY Negative for prolonged bleeding, bruising easily, and swollen nodes. ENDOCRINE: Negative for cold or heat intolerance, polyuria, polydipsia and goiter. NEURO: negative for tremor, gait imbalance, syncope and seizures. The remainder of the review of systems is noncontributory.   Physical Exam: BP (!) 159/79 (BP Location: Left Arm, Patient Position: Sitting, Cuff Size: Large)   Pulse 80   Temp 98.3 F (36.8 C) (Oral)   Ht 5\' 8"  (1.727 m)   Wt 223 lb (101.2 kg)   BMI 33.91 kg/m  GENERAL: The patient is AO x3, in no acute distress. HEENT: Head is normocephalic and atraumatic. EOMI are intact. Mouth is well hydrated and without lesions. NECK: Supple. No masses LUNGS: Clear to auscultation. No presence of rhonchi/wheezing/rales. Adequate chest expansion HEART: RRR, normal s1 and s2. ABDOMEN: Soft, nontender, no guarding, no peritoneal signs, and nondistended. BS +. No masses. EXTREMITIES: Without any cyanosis, clubbing, rash, lesions or edema. NEUROLOGIC: AOx3, no focal motor deficit. SKIN: no jaundice, no rashes  Imaging/Labs: as above  I personally reviewed and interpreted the available labs, imaging and endoscopic files.  Impression and Plan: Robert Montoya is a 65 y.o. male past medical history of choledocholithiasis and GERD, who presents for evaluation of right upper quadrant pain.  The patient presented with an isolated episode of pain of unclear etiology.  It is possible that due to the short span of his symptoms, this is related to symptomatic biliary stones but he has been asymptomatic since then.  I explained to him that if this pain recurs he needs to give [place] a call back as he may need evaluation either in the ER or consider an evaluation by general surgeon for cholecystectomy.  He understood this.  As he has been asymptomatic since then, no other intervention is warranted at this point.   He is up-to-date with colorectal cancer screening.  -Patient to call us back if you are having recurrent abdominal pain, if that's the situation will need to determine possible need for emergent ER evaluation versus referral to general surgery  All questions were answered.      Harvel Quale, MD Gastroenterology and Hepatology [facility]

## 2020-07-17 NOTE — Patient Instructions (Addendum)
Please call us back if you are having recurrent abdominal pain, if that's the situation will need to determine possible need for emergent ER evaluation versus referral to general surgery

## 2020-11-07 DIAGNOSIS — I10 Essential (primary) hypertension: Secondary | ICD-10-CM | POA: Diagnosis not present

## 2020-11-07 DIAGNOSIS — E559 Vitamin D deficiency, unspecified: Secondary | ICD-10-CM | POA: Diagnosis not present

## 2020-11-07 DIAGNOSIS — E785 Hyperlipidemia, unspecified: Secondary | ICD-10-CM | POA: Diagnosis not present

## 2020-11-13 DIAGNOSIS — E559 Vitamin D deficiency, unspecified: Secondary | ICD-10-CM | POA: Diagnosis not present

## 2020-11-13 DIAGNOSIS — I10 Essential (primary) hypertension: Secondary | ICD-10-CM | POA: Diagnosis not present

## 2020-11-13 DIAGNOSIS — E785 Hyperlipidemia, unspecified: Secondary | ICD-10-CM | POA: Diagnosis not present

## 2020-11-13 DIAGNOSIS — Z125 Encounter for screening for malignant neoplasm of prostate: Secondary | ICD-10-CM | POA: Diagnosis not present

## 2021-02-05 ENCOUNTER — Other Ambulatory Visit: Payer: Self-pay

## 2021-02-05 ENCOUNTER — Other Ambulatory Visit (HOSPITAL_COMMUNITY): Payer: Self-pay | Admitting: Adult Health

## 2021-02-05 ENCOUNTER — Ambulatory Visit (HOSPITAL_COMMUNITY)
Admission: RE | Admit: 2021-02-05 | Discharge: 2021-02-05 | Disposition: A | Discharge status: Home / Self Care | Payer: PPO | Source: Ambulatory Visit | Attending: Adult Health | Admitting: Adult Health

## 2021-02-05 DIAGNOSIS — R1084 Generalized abdominal pain: Secondary | ICD-10-CM | POA: Diagnosis not present

## 2021-02-05 DIAGNOSIS — R109 Unspecified abdominal pain: Secondary | ICD-10-CM | POA: Insufficient documentation

## 2021-02-05 DIAGNOSIS — K5900 Constipation, unspecified: Secondary | ICD-10-CM | POA: Diagnosis not present

## 2021-02-05 DIAGNOSIS — R143 Flatulence: Secondary | ICD-10-CM | POA: Diagnosis not present

## 2021-02-19 DIAGNOSIS — R143 Flatulence: Secondary | ICD-10-CM | POA: Diagnosis not present

## 2021-02-19 DIAGNOSIS — R109 Unspecified abdominal pain: Secondary | ICD-10-CM | POA: Diagnosis not present

## 2021-02-19 DIAGNOSIS — Z0000 Encounter for general adult medical examination without abnormal findings: Secondary | ICD-10-CM | POA: Diagnosis not present

## 2021-02-19 DIAGNOSIS — I10 Essential (primary) hypertension: Secondary | ICD-10-CM | POA: Diagnosis not present

## 2021-02-19 DIAGNOSIS — Z23 Encounter for immunization: Secondary | ICD-10-CM | POA: Diagnosis not present

## 2021-03-30 DIAGNOSIS — Z131 Encounter for screening for diabetes mellitus: Secondary | ICD-10-CM | POA: Diagnosis not present

## 2021-03-30 DIAGNOSIS — Z79899 Other long term (current) drug therapy: Secondary | ICD-10-CM | POA: Diagnosis not present

## 2021-03-30 DIAGNOSIS — E785 Hyperlipidemia, unspecified: Secondary | ICD-10-CM | POA: Diagnosis not present

## 2021-03-30 DIAGNOSIS — Z1329 Encounter for screening for other suspected endocrine disorder: Secondary | ICD-10-CM | POA: Diagnosis not present

## 2021-03-30 DIAGNOSIS — I10 Essential (primary) hypertension: Secondary | ICD-10-CM | POA: Diagnosis not present

## 2021-03-30 DIAGNOSIS — Z125 Encounter for screening for malignant neoplasm of prostate: Secondary | ICD-10-CM | POA: Diagnosis not present

## 2021-04-02 ENCOUNTER — Other Ambulatory Visit: Payer: Self-pay

## 2021-04-02 ENCOUNTER — Encounter (INDEPENDENT_AMBULATORY_CARE_PROVIDER_SITE_OTHER): Payer: Self-pay | Admitting: Gastroenterology

## 2021-04-02 ENCOUNTER — Ambulatory Visit (INDEPENDENT_AMBULATORY_CARE_PROVIDER_SITE_OTHER): Payer: PPO | Admitting: Gastroenterology

## 2021-04-02 VITALS — BP 123/65 | HR 79 | Temp 97.9°F | Ht 67.5 in | Wt 225.7 lb

## 2021-04-02 DIAGNOSIS — K8020 Calculus of gallbladder without cholecystitis without obstruction: Secondary | ICD-10-CM | POA: Diagnosis not present

## 2021-04-02 DIAGNOSIS — K581 Irritable bowel syndrome with constipation: Secondary | ICD-10-CM | POA: Diagnosis not present

## 2021-04-02 DIAGNOSIS — E739 Lactose intolerance, unspecified: Secondary | ICD-10-CM | POA: Diagnosis not present

## 2021-04-02 DIAGNOSIS — R140 Abdominal distension (gaseous): Secondary | ICD-10-CM | POA: Diagnosis not present

## 2021-04-02 DIAGNOSIS — K589 Irritable bowel syndrome without diarrhea: Secondary | ICD-10-CM | POA: Insufficient documentation

## 2021-04-02 DIAGNOSIS — K644 Residual hemorrhoidal skin tags: Secondary | ICD-10-CM

## 2021-04-02 NOTE — Progress Notes (Signed)
Robert Montoya, M.D. Gastroenterology & Hepatology [facility] [address] [place], [place] [address]  Primary Care Physician: Denyce Robert, [place] [address]  I will communicate my assessment and recommendations to the referring MD via EMR.  Problems: Bloating Hemorrhoidal bleeding Cholelithiasis History of diverticular bleeding  History of Present Illness: Robert Montoya is a 65 y.o. male past medical history of cholelithiasis, hemorrhoids, history of diverticular bleeding and GERD,who presents for follow up of hemorrhoidal bleeding and bloating.  The patient was last seen on 07/17/2020. At that time, the patient was being seen for episodes of pain in the right upper quadrant which were possibly related to transient biliary colic.  He was advised to call us back if he presented new episodes to refer him to a general surgeon as he was not interested in pursuing any surgery at the moment.  He has not present any more episodes of pain as in the past.  States he has presented recurrent episodes of severe bloating for the last 1.5 months. Due to this he underwent a KUB which showed normal gas pattern and gallstone, but no other alteration explain his symptoms. Patient reports that he noticed that after he stopped drinking milk, he has presented less bloating than in the past. He still has some episodes of bloating but these are less severe than in the past.  Overall, he feels that his symptoms improve much more after stopping the intake of milk.  He also reports reports a longstanding history of cosntipation. He takes Miralax 1 capful every day, but does not take it often. The patient reports that he has presented recurrent episodes of rectal bleeding and fullness in his rectal area, which he believes are related to his hemorrhoids. He was prescribed Preparation H but his PCP but would like to know if he  should take any other medication for his rectal bleeding episodes.   The patient denies having any nausea, vomiting, fever, chills, melena, hematemesis, abdominal pain, diarrhea, jaundice, pruritus or weight loss.  Last EGD: never Last Colonoscopy: 4/ 2021 - One diminutive polyp in the cecum. Path TA. - Two small polyps at the splenic flexure and in the transverse colon, removed with a cold snare. Resected and retrieved. Path TA. - Diverticulosis in the sigmoid colon. - External hemorrhoids with single non-bleeding erosiona t dentate line.  Past Medical History: Past Medical History:  Diagnosis Date   GERD (gastroesophageal reflux disease)    Hemorrhoids     Past Surgical History: Past Surgical History:  Procedure Laterality Date   BIOPSY  08/13/2019   Procedure: BIOPSY;  Surgeon: Rogene Houston, MD;  [facility];  Service: Endoscopy;;   CARDIAC CATHETERIZATION     COLONOSCOPY N/A 12/05/2013   Procedure: COLONOSCOPY;  Surgeon: Rogene Houston, MD;  [facility];  Service: Endoscopy;  Laterality: N/A;  1200   COLONOSCOPY N/A 08/13/2019   Procedure: COLONOSCOPY;  Surgeon: Rogene Houston, MD;  [facility];  Service: Endoscopy;  Laterality: N/A;   POLYPECTOMY  08/13/2019   Procedure: POLYPECTOMY;  Surgeon: Rogene Houston, MD;  [facility];  Service: Endoscopy;;    Family History: Family History  Problem Relation Age of Onset   Colon cancer Maternal Uncle    Colon cancer Paternal Aunt     Social History: Social History   Tobacco Use  Smoking Status Every Day   Packs/day: 0.25  Types: Cigarettes  Smokeless Tobacco Never   Social History   Substance and Sexual Activity  Alcohol Use No   Social History   Substance and Sexual Activity  Drug Use Never    Allergies: No Known Allergies  Medications: Current Outpatient Medications  Medication Sig Dispense Refill   lisinopril (ZESTRIL) 20 MG tablet Take 20 mg by mouth  daily.     No current facility-administered medications for this visit.    Review of Systems: GENERAL: negative for malaise, night sweats HEENT: No changes in hearing or vision, no nose bleeds or other nasal problems. NECK: Negative for lumps, goiter, pain and significant neck swelling RESPIRATORY: Negative for cough, wheezing CARDIOVASCULAR: Negative for chest pain, leg swelling, palpitations, orthopnea GI: SEE HPI MUSCULOSKELETAL: Negative for joint pain or swelling, back pain, and muscle pain. SKIN: Negative for lesions, rash PSYCH: Negative for sleep disturbance, mood disorder and recent psychosocial stressors. HEMATOLOGY Negative for prolonged bleeding, bruising easily, and swollen nodes. ENDOCRINE: Negative for cold or heat intolerance, polyuria, polydipsia and goiter. NEURO: negative for tremor, gait imbalance, syncope and seizures. The remainder of the review of systems is noncontributory.   Physical Exam: BP 123/65 (BP Location: Left Arm, Patient Position: Sitting, Cuff Size: Large)   Pulse 79   Temp 97.9 F (36.6 C) (Oral)   Ht 5' 7.5" (1.715 m)   Wt 225 lb 11.2 oz (102.4 kg)   BMI 34.83 kg/m  GENERAL: The patient is AO x3, in no acute distress. HEENT: Head is normocephalic and atraumatic. EOMI are intact. Mouth is well hydrated and without lesions. NECK: Supple. No masses LUNGS: Clear to auscultation. No presence of rhonchi/wheezing/rales. Adequate chest expansion HEART: RRR, normal s1 and s2. ABDOMEN: Soft, nontender, no guarding, no peritoneal signs, and nondistended. BS +. No masses. RECTAL EXAM: deferred EXTREMITIES: Without any cyanosis, clubbing, rash, lesions or edema. NEUROLOGIC: AOx3, no focal motor deficit. SKIN: no jaundice, no rashes  Imaging/Labs: as above  I personally reviewed and interpreted the available labs, imaging and endoscopic files.  Impression and Plan: Robert Montoya is a 65 y.o. male past medical history of cholelithiasis,  hemorrhoids, history of diverticular bleeding and GERD,who presents for follow up of hemorrhoidal bleeding and bloating.  The patient has presented recurrent episodes of rectal bleeding.  He had a relatively recent endoscopy that showed presence of diverticulosis but also hemorrhoids.  Given his history of longstanding constipation and hemorrhoids, it is likely his symptoms are related to bleeding from the hemorrhoids.  I advised him to take MiraLAX on a daily basis to soften his bowel movements but he will also benefit from taking the Preparation H to relieve the symptoms related to hemorrhoids.  He understood and agreed.  He would like to avoid any hemorrhoidectomy unless history is necessary, which I consider adequate.  In terms of his bloating, it is possible that he has some lactose intolerance as he noticed improvement after discontinuing the intake of milk.  I encouraged him to do a trial of a week without eating any milk-based products and see how he feels.  If he is interested in taking milk based products in the future, he can try taking lactase supplements.  We will check a TSH and celiac testing as part of the evaluation of his bloating.  - Continue Preparation H  - Start taking Miralax 1 capful every day -Check TSH and celiac serology - Stop all milk based products for a week. If bloating resolves completely, it is likely related to  lactose intolerance. Can take lactase enzymes (sold over the counter) prior to eating milk based products.  All questions were answered.      Harvel Quale, MD Gastroenterology and Hepatology [facility]

## 2021-04-02 NOTE — Patient Instructions (Addendum)
Continue Preparation H  Start taking Miralax 1 capful every day Perform blood workup Stop all milk based products for a week If bloating resolves completely, it is likely related to lactose intolerance. Can take lactase enzymes (sold over the counter) prior to eating milk based products.

## 2021-04-05 LAB — CELIAC DISEASE PANEL
Endomysial IgA: NEGATIVE
IgA/Immunoglobulin A, Serum: 130 mg/dL (ref 61–437)
Transglutaminase IgA: <2 U/mL (ref 0–3)

## 2021-04-05 LAB — TSH: TSH: 1.81 u[IU]/mL (ref 0.450–4.500)

## 2021-07-07 ENCOUNTER — Other Ambulatory Visit (HOSPITAL_COMMUNITY): Payer: Self-pay | Admitting: Nurse Practitioner

## 2021-07-07 ENCOUNTER — Ambulatory Visit (HOSPITAL_COMMUNITY)
Admission: RE | Admit: 2021-07-07 | Discharge: 2021-07-07 | Disposition: A | Discharge status: Home / Self Care | Payer: PPO | Source: Ambulatory Visit | Attending: Nurse Practitioner | Admitting: Nurse Practitioner

## 2021-07-07 ENCOUNTER — Other Ambulatory Visit: Payer: Self-pay

## 2021-07-07 DIAGNOSIS — R051 Acute cough: Secondary | ICD-10-CM | POA: Diagnosis not present

## 2021-08-02 ENCOUNTER — Ambulatory Visit
Admission: EM | Admit: 2021-08-02 | Discharge: 2021-08-02 | Disposition: A | Discharge status: Home / Self Care | Payer: PPO

## 2021-08-02 ENCOUNTER — Ambulatory Visit (HOSPITAL_COMMUNITY)
Admission: RE | Admit: 2021-08-02 | Discharge: 2021-08-02 | Disposition: A | Discharge status: Home / Self Care | Payer: PPO | Source: Ambulatory Visit | Attending: Urgent Care | Admitting: Urgent Care

## 2021-08-02 ENCOUNTER — Encounter: Payer: Self-pay | Admitting: Emergency Medicine

## 2021-08-02 DIAGNOSIS — R051 Acute cough: Secondary | ICD-10-CM | POA: Diagnosis not present

## 2021-08-02 DIAGNOSIS — R0602 Shortness of breath: Secondary | ICD-10-CM | POA: Insufficient documentation

## 2021-08-02 DIAGNOSIS — R0789 Other chest pain: Secondary | ICD-10-CM | POA: Diagnosis not present

## 2021-08-02 DIAGNOSIS — Z87891 Personal history of nicotine dependence: Secondary | ICD-10-CM | POA: Diagnosis not present

## 2021-08-02 DIAGNOSIS — R059 Cough, unspecified: Secondary | ICD-10-CM | POA: Insufficient documentation

## 2021-08-02 DIAGNOSIS — J209 Acute bronchitis, unspecified: Secondary | ICD-10-CM

## 2021-08-02 MED ORDER — BENZONATATE 100 MG PO CAPS: 100.0000 mg | ORAL_CAPSULE | Freq: Three times a day (TID) | ORAL | 0 refills | Status: AC | PRN

## 2021-08-02 MED ORDER — PREDNISONE 20 MG PO TABS: ORAL_TABLET | ORAL | 0 refills | Status: DC

## 2021-08-02 MED ORDER — PROMETHAZINE-DM 6.25-15 MG/5ML PO SYRP: 5.0000 mL | ORAL_SOLUTION | Freq: Every evening | ORAL | 0 refills | Status: DC | PRN

## 2021-08-02 NOTE — ED Provider Notes (Signed)
?[place] ? ? ?MRN: [id number] DOB: April 12, 1956 ? ?Subjective:  ? ?Robert Montoya is a 66 y.o. male presenting for 1 week history of recurrent productive cough, sinus congestion.  Cough elicits right ear pain, chest pain.  Of note, patient did have COVID 1-1/2 months ago.  He subsequently was seen by his regular doctor in early March and was treated with prednisone and doxycycline.  He did have a chest x-ray and was negative.  Patient reports that he has had bouts of bronchitis and usually gets antibiotics to help with this.  No active shortness of breath or wheezing. ? ?No current facility-administered medications for this encounter. ? ?Current Outpatient Medications:  ?  albuterol (VENTOLIN HFA) 108 (90 Base) MCG/ACT inhaler, Inhale into the lungs every 6 (six) hours as needed for wheezing or shortness of breath., Disp: , Rfl:  ?  lisinopril (ZESTRIL) 20 MG tablet, Take 20 mg by mouth daily., Disp: , Rfl:   ? ?No Known Allergies ? ?Past Medical History:  ?Diagnosis Date  ? GERD (gastroesophageal reflux disease)   ? Hemorrhoids   ?  ? ?Past Surgical History:  ?Procedure Laterality Date  ? BIOPSY  08/13/2019  ? Procedure: BIOPSY;  Surgeon: Rogene Houston, MD;  [facility];  Service: Endoscopy;;  ? CARDIAC CATHETERIZATION    ? COLONOSCOPY N/A 12/05/2013  ? Procedure: COLONOSCOPY;  Surgeon: Rogene Houston, MD;  [facility];  Service: Endoscopy;  Laterality: N/A;  1200  ? COLONOSCOPY N/A 08/13/2019  ? Procedure: COLONOSCOPY;  Surgeon: Rogene Houston, MD;  [facility];  Service: Endoscopy;  Laterality: N/A;  ? POLYPECTOMY  08/13/2019  ? Procedure: POLYPECTOMY;  Surgeon: Rogene Houston, MD;  [facility];  Service: Endoscopy;;  ? ? ?Family History  ?Problem Relation Age of Onset  ? Colon cancer Maternal Uncle   ? Colon cancer Paternal Aunt   ? ? ?Social History  ? ?Tobacco Use  ? Smoking status: Former  ?  Packs/day: 0.25  ?  Types: Cigarettes  ?  Smokeless tobacco: Never  ?Vaping Use  ? Vaping Use: Never used  ?Substance Use Topics  ? Alcohol use: No  ? Drug use: Never  ? ? ?ROS ? ? ?Objective:  ? ?Vitals: ?BP (!) 154/85 (BP Location: Right Arm)   Pulse 95   Temp 98.2 ?F (36.8 ?C) (Oral)   Resp 18   SpO2 95%  ? ?Physical Exam ?Constitutional:   ?   General: He is not in acute distress. ?   Appearance: Normal appearance. He is well-developed. He is not ill-appearing, toxic-appearing or diaphoretic.  ?HENT:  ?   Head: Normocephalic and atraumatic.  ?   Right Ear: External ear normal.  ?   Left Ear: External ear normal.  ?   Nose: Nose normal.  ?   Mouth/Throat:  ?   Mouth: Mucous membranes are moist.  ?   Pharynx: No oropharyngeal exudate or posterior oropharyngeal erythema.  ?Eyes:  ?   General: No scleral icterus.    ?   Right eye: No discharge.     ?   Left eye: No discharge.  ?   Extraocular Movements: Extraocular movements intact.  ?   Conjunctiva/sclera: Conjunctivae normal.  ?Cardiovascular:  ?   Rate and Rhythm: Normal rate and regular rhythm.  ?   Heart sounds: Normal heart sounds. No murmur heard. ?  No friction rub. No gallop.  ?Pulmonary:  ?  Effort: Pulmonary effort is normal. No respiratory distress.  ?   Breath sounds: No stridor. Examination of the right-upper field reveals rhonchi. Examination of the left-upper field reveals rhonchi. Examination of the right-middle field reveals rhonchi. Examination of the left-middle field reveals rhonchi. Rhonchi present. No wheezing or rales.  ?Neurological:  ?   Mental Status: He is alert and oriented to person, place, and time.  ?Psychiatric:     ?   Mood and Affect: Mood normal.     ?   Behavior: Behavior normal.     ?   Thought Content: Thought content normal.  ? ? ?Assessment and Plan :  ? ?PDMP not reviewed this encounter. ? ?1. Acute bronchitis, unspecified organism   ?2. Acute cough   ?3. Atypical chest pain   ?4. Former smoker   ? ? ?Patient insisted on an antibiotic course but I declined as  he has recently completed 1 with a negative chest x-ray.  I advised that we would pursue a chest x-ray to definitively rule out whether or not he has pneumonia and that this would dictate whether or not we use antibiotics.  Otherwise her working diagnosis is acute bronchitis which is almost exclusively viral.  He is no longer smoking and therefore I will avoid the use of antibiotics.  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  In the context of his cough, respiratory symptoms and history of smoking he would benefit from the steroid course as well.  Otherwise recommend supportive care for an acute viral syndrome.  Today, we do not have a radiology technologist onsite, patient was sent to [facility] for an outpatient x-ray.  Radiology over read pending.  Counseled patient on potential for adverse effects with medications prescribed/recommended today, ER and return-to-clinic precautions discussed, patient verbalized understanding. ? ?  ?Jaynee Eagles, PA-C ?08/02/21 0539 ? ?

## 2021-08-02 NOTE — ED Triage Notes (Signed)
Cough with yellow sputum off and on x 1 week.  States he get bronchitis a few times a year.  Had covid 1.5 months ago.  States when he coughs, his right ear hurts. ?

## 2021-11-10 IMAGING — DX DG CHEST 1V PORT
2 series · 2 of 2 positions shown · non-contrast
Comparison: Chest radiograph dated 03/13/2016.

CLINICAL DATA: 64-year-old male with rectal bleeding.

EXAM:
PORTABLE CHEST 1 VIEW

[chest ap (1 of 2)]
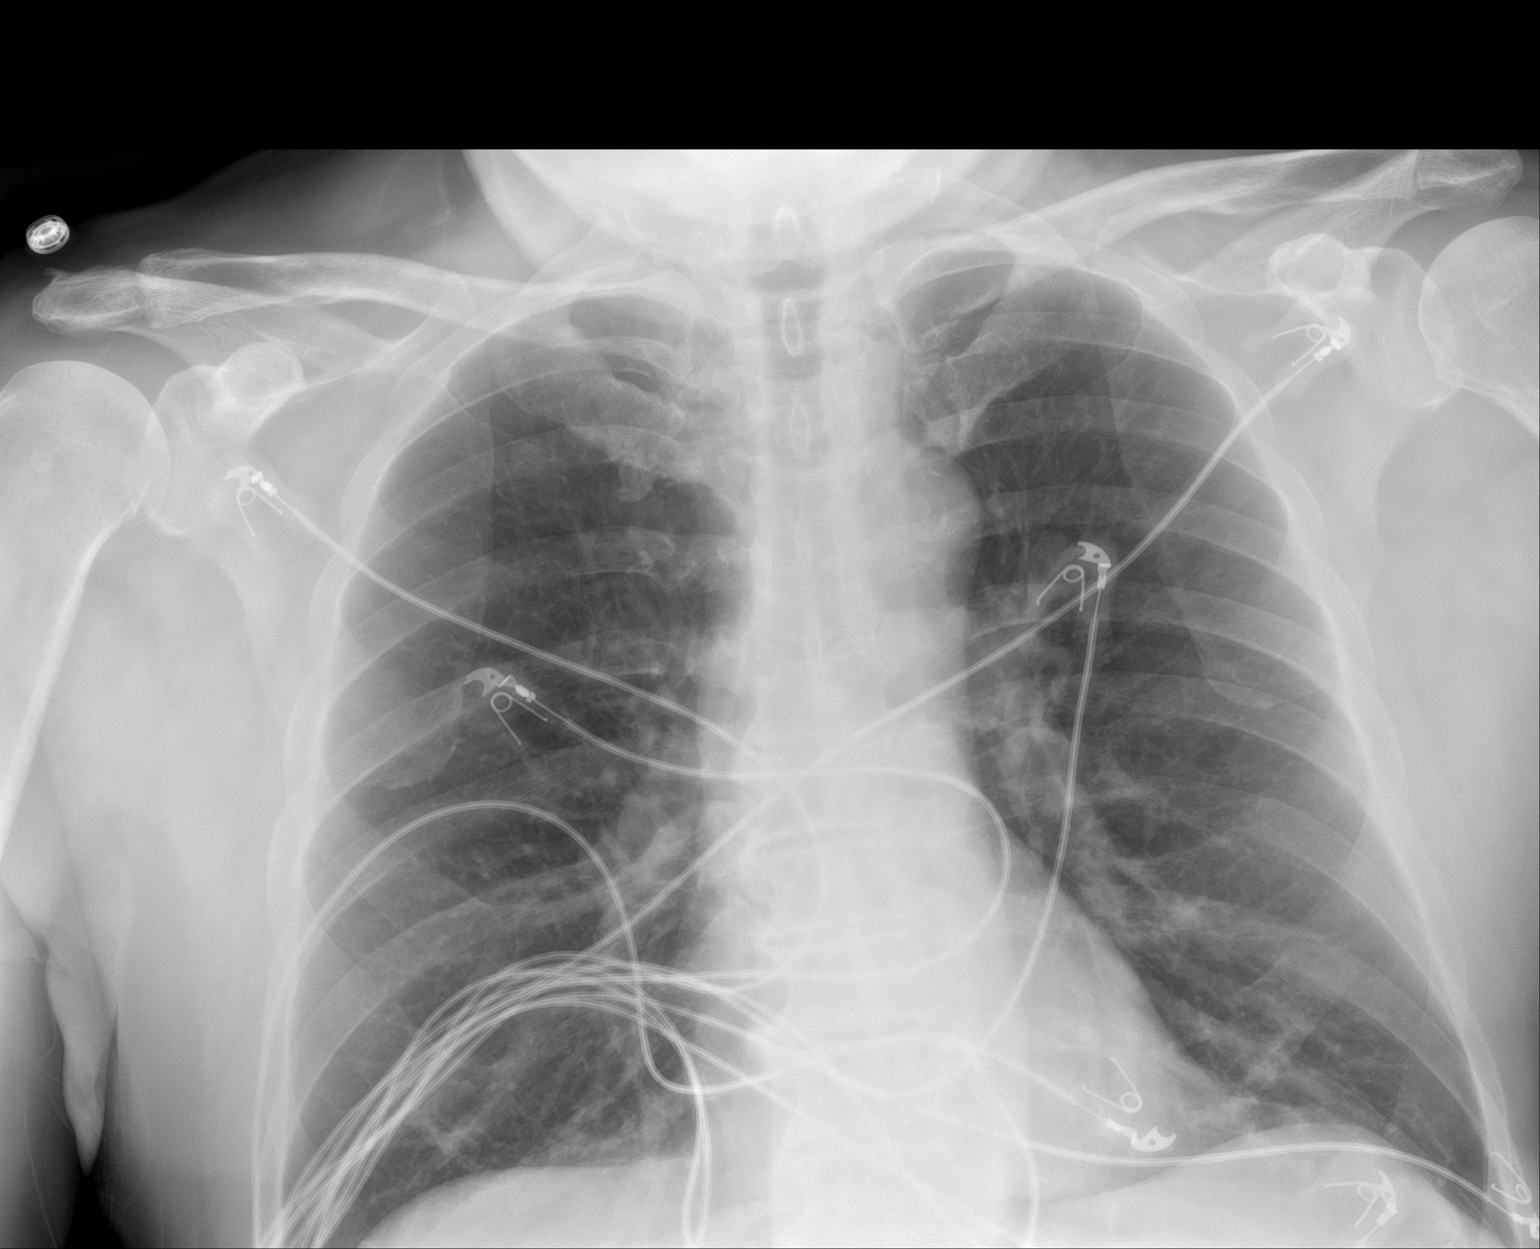

[chest ap (2 of 2)]
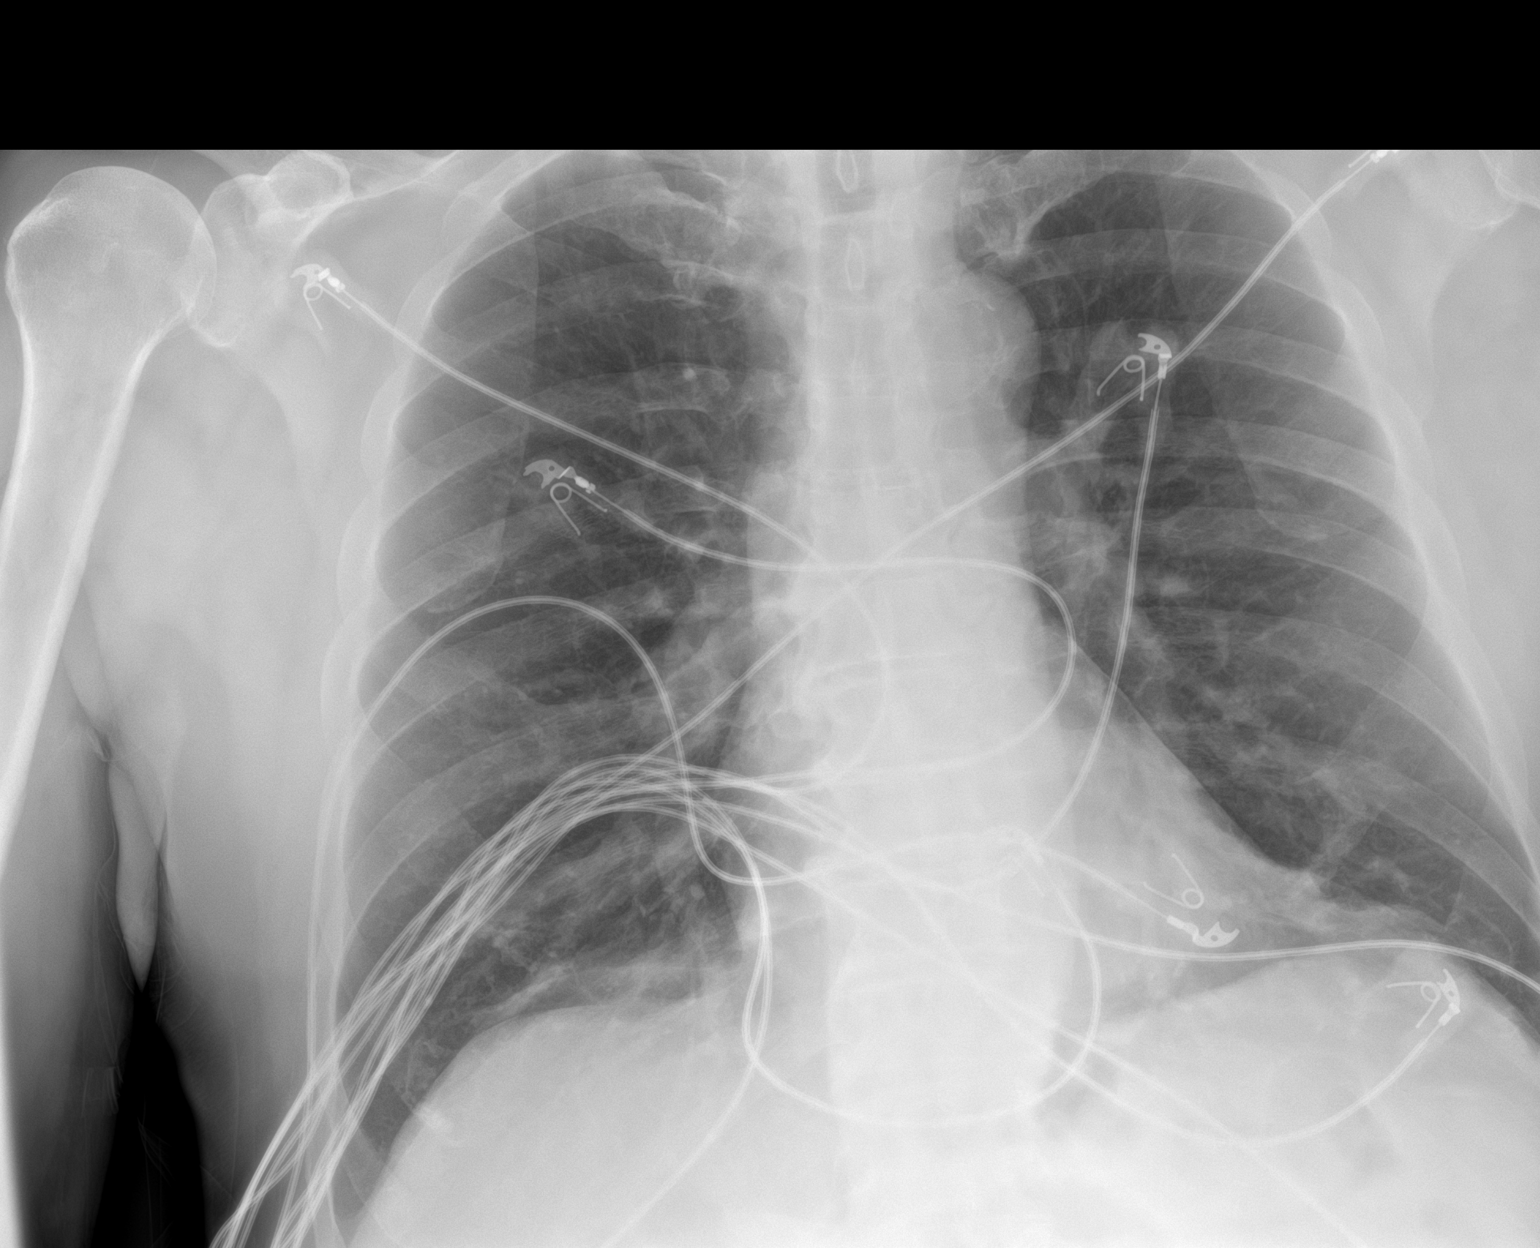

[2 of 2 positions shown; findings below may reference images not displayed]

FINDINGS: Minimal bibasilar atelectasis. There is no focal consolidation,
pleural effusion or pneumothorax. The cardiac silhouette is within
normal limits. No acute osseous pathology. Atherosclerotic
calcification of the aorta.
IMPRESSION: No active disease.

## 2021-11-11 ENCOUNTER — Ambulatory Visit
Admission: EM | Admit: 2021-11-11 | Discharge: 2021-11-11 | Disposition: A | Discharge status: Home / Self Care | Payer: PPO | Attending: Nurse Practitioner | Admitting: Nurse Practitioner

## 2021-11-11 ENCOUNTER — Encounter: Payer: Self-pay | Admitting: Emergency Medicine

## 2021-11-11 ENCOUNTER — Other Ambulatory Visit: Payer: Self-pay

## 2021-11-11 DIAGNOSIS — S39012A Strain of muscle, fascia and tendon of lower back, initial encounter: Secondary | ICD-10-CM

## 2021-11-11 MED ORDER — CYCLOBENZAPRINE HCL 10 MG PO TABS: 10.0000 mg | ORAL_TABLET | Freq: Every day | ORAL | 0 refills | Status: AC

## 2021-11-11 MED ORDER — IBUPROFEN 800 MG PO TABS: 800.0000 mg | ORAL_TABLET | Freq: Three times a day (TID) | ORAL | 0 refills | Status: AC | PRN

## 2021-11-11 NOTE — ED Triage Notes (Signed)
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   Pt denies pain but reports "it catches every so often."

## 2021-11-11 NOTE — ED Provider Notes (Signed)
[facility]    CSN: [id number] Arrival date & time: 11/11/21  0817      History   Chief Complaint Chief Complaint  Patient presents with   Back Pain    HPI Robert Montoya is a 66 y.o. male.   The history is provided by the patient.    Patient presents for complaints of low back pain for the past 3 days.  Patient states he was pulling a riding lawn more away from some yellow jackets when his pain started.  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  He states the pain "catches" every so often.  Pain worsens with prolonged sitting and overhead reaching.  He denies fever, chills, abdominal pain, loss of bowel or bladder function, change in bowel habits, numbness, tingling, or radiation of pain.  Patient states that he took ibuprofen last evening for his symptoms.  Denies any previous history of cancer or back injury.  Past Medical History:  Diagnosis Date   GERD (gastroesophageal reflux disease)    Hemorrhoids     Patient Active Problem List   Diagnosis Date Noted   IBS (irritable bowel syndrome) 04/02/2021   Lactose intolerance 04/02/2021   Bloating 04/02/2021   External hemorrhoids 04/02/2021   Cholelithiasis 07/17/2020   Tobacco abuse 08/12/2019    Past Surgical History:  Procedure Laterality Date   BIOPSY  08/13/2019   Procedure: BIOPSY;  Surgeon: Rogene Houston, MD;  Location: [facility];  Service: Endoscopy;;   CARDIAC CATHETERIZATION     COLONOSCOPY N/A 12/05/2013   Procedure: COLONOSCOPY;  Surgeon: Rogene Houston, MD;  Location: [facility];  Service: Endoscopy;  Laterality: N/A;  1200   COLONOSCOPY N/A 08/13/2019   Procedure: COLONOSCOPY;  Surgeon: Rogene Houston, MD;  Location: [facility];  Service: Endoscopy;  Laterality: N/A;   POLYPECTOMY  08/13/2019   Procedure: POLYPECTOMY;  Surgeon: Rogene Houston, MD;  Location: [facility];  Service: Endoscopy;;       Home Medications     Prior to Admission medications   Medication Sig Start Date End Date Taking? Authorizing Provider  cyclobenzaprine (FLEXERIL) 10 MG tablet Take 1 tablet (10 mg total) by mouth at bedtime. 11/11/21  Yes Neely Cecena-Warren, Alda Lea, NP  ibuprofen (ADVIL) 800 MG tablet Take 1 tablet (800 mg total) by mouth every 8 (eight) hours as needed. 11/11/21  Yes Belia Febo-Warren, Alda Lea, NP  lisinopril (ZESTRIL) 20 MG tablet Take 20 mg by mouth daily. 05/28/19  Yes [provider]  albuterol (VENTOLIN HFA) 108 (90 Base) MCG/ACT inhaler Inhale into the lungs every 6 (six) hours as needed for wheezing or shortness of breath.    [provider]  benzonatate (TESSALON) 100 MG capsule Take 1-2 capsules (100-200 mg total) by mouth 3 (three) times daily as needed for cough. 08/02/21   Jaynee Eagles, PA-C  predniSONE (DELTASONE) 20 MG tablet Take 2 tablets daily with breakfast. 08/02/21   Jaynee Eagles, PA-C  promethazine-dextromethorphan (PROMETHAZINE-DM) 6.25-15 MG/5ML syrup Take 5 mLs by mouth at bedtime as needed for cough. 08/02/21   Jaynee Eagles, PA-C    Family History Family History  Problem Relation Age of Onset   Colon cancer Maternal Uncle    Colon cancer Paternal Aunt     Social History Social History   Tobacco Use   Smoking status: Former    Packs/day: 0.25    Types: Cigarettes   Smokeless tobacco:  Never  Vaping Use   Vaping Use: Never used  Substance Use Topics   Alcohol use: No   Drug use: Never     Allergies   Patient has no known allergies.   Review of Systems Review of Systems Per HPI  Physical Exam Triage Vital Signs ED Triage Vitals [11/11/21 0831]  Enc Vitals Group     BP (!) 161/79     Pulse Rate 77     Resp 18     Temp 97.9 F (36.6 C)     Temp Source Oral     SpO2 98 %     Weight      Height      Head Circumference      Peak Flow      Pain Score 0     Pain Loc      Pain Edu?      Excl. in [place]?    No data found.  Updated Vital Signs BP (!) 161/79  (BP Location: Right Arm)   Pulse 77   Temp 97.9 F (36.6 C) (Oral)   Resp 18   SpO2 98%   Visual Acuity Right Eye Distance:   Left Eye Distance:   Bilateral Distance:    Right Eye Near:   Left Eye Near:    Bilateral Near:     Physical Exam Vitals and nursing note reviewed.  Constitutional:      General: He is not in acute distress.    Appearance: He is well-developed.  HENT:     Head: Normocephalic and atraumatic.  Eyes:     Conjunctiva/sclera: Conjunctivae normal.  Cardiovascular:     Rate and Rhythm: Regular rhythm.     Heart sounds: No murmur heard. Pulmonary:     Effort: Pulmonary effort is normal. No respiratory distress.     Breath sounds: Normal breath sounds.  Abdominal:     General: Bowel sounds are normal.     Palpations: Abdomen is soft.     Tenderness: There is no abdominal tenderness.  Musculoskeletal:        General: No swelling.     Cervical back: Neck supple.     Lumbar back: Spasms and tenderness present. No swelling, deformity or bony tenderness. Normal range of motion. Negative right straight leg raise test and negative left straight leg raise test.     Comments: Tenderness noted to L3-L5.  Spasm noted in the paraspinal muscles of the right lower back.  Skin:    General: Skin is warm and dry.     Capillary Refill: Capillary refill takes less than 2 seconds.  Neurological:     Mental Status: He is alert.  Psychiatric:        Mood and Affect: Mood normal.        Behavior: Behavior normal.      UC Treatments / Results  Labs (all labs ordered are listed, but only abnormal results are displayed) Labs Reviewed - No data to display  EKG   Radiology No results found.  Procedures Procedures (including critical care time)  Medications Ordered in UC Medications - No data to display  Initial Impression / Assessment and Plan / UC Course  I have reviewed the triage vital signs and the nursing notes.  Pertinent labs & imaging results that  were available during my care of the patient were reviewed by me and considered in my medical decision making (see chart for details).  Patient presents for complaints of low back pain  that been present for the past 3 days.,  Patient has tenderness to the lumbar spine from L3-L5.  He also has tenderness in the right paraspinal muscles of the right lower back.  Spasms are also noted in this area.  No red flag symptoms noted on exam.  Symptoms are consistent with a low back strain based on the mechanism of injury.  Will prescribe patient ibuprofen and cyclobenzaprine.  Patient was encouraged to remain as active as possible.  Discussed with patient the use of heat for his spasms.  Supportive care recommendations were provided to the patient.  Work note was provided.  Strict indications of when to go to the emergency department were provided to the patient.  Patient encouraged to follow-up with his PCP if symptoms do not improve. Final Clinical Impressions(s) / UC Diagnoses   Final diagnoses:  Strain of lumbar region, initial encounter     Discharge Instructions      Your symptoms are consistent with a lumbar strain. Take medication as prescribed.  Take ibuprofen with food and water.  Take the muscle relaxer at bedtime as it will make you drowsy.  When taking the muscle relaxer, no driving, operate any heavy equipment, or drinking alcohol. Apply heat for spasms.  Apply for 20 minutes, remove for 1 hour, then repeat. Gentle range of motion and stretching exercises to help you remain as active as possible. Go to the emergency department immediately if you develop loss of bowel or bladder function, weakness/tingling/numbness in your legs, inability to walk or move. Follow-up with your primary care physician if your symptoms do not improve.     ED Prescriptions     Medication Sig Dispense Auth. Provider   ibuprofen (ADVIL) 800 MG tablet Take 1 tablet (800 mg total) by mouth every 8 (eight) hours as  needed. 30 tablet Liliya Fullenwider-Warren, Alda Lea, NP   cyclobenzaprine (FLEXERIL) 10 MG tablet Take 1 tablet (10 mg total) by mouth at bedtime. 20 tablet Saad Buhl-Warren, Alda Lea, NP      PDMP not reviewed this encounter.   Tish Men, NP 11/11/21 [phone]

## 2021-11-11 NOTE — Discharge Instructions (Signed)
Your symptoms are consistent with a lumbar strain. Take medication as prescribed.  Take ibuprofen with food and water.  Take the muscle relaxer at bedtime as it will make you drowsy.  When taking the muscle relaxer, no driving, operate any heavy equipment, or drinking alcohol. Apply heat for spasms.  Apply for 20 minutes, remove for 1 hour, then repeat. Gentle range of motion and stretching exercises to help you remain as active as possible. Go to the emergency department immediately if you develop loss of bowel or bladder function, weakness/tingling/numbness in your legs, inability to walk or move. Follow-up with your primary care physician if your symptoms do not improve.

## 2022-03-18 ENCOUNTER — Ambulatory Visit (HOSPITAL_COMMUNITY)
Admission: RE | Admit: 2022-03-18 | Discharge: 2022-03-18 | Disposition: A | Discharge status: Home / Self Care | Payer: PPO | Source: Ambulatory Visit | Attending: Adult Health | Admitting: Adult Health

## 2022-03-18 ENCOUNTER — Other Ambulatory Visit (HOSPITAL_COMMUNITY): Payer: Self-pay | Admitting: Adult Health

## 2022-03-18 DIAGNOSIS — F17210 Nicotine dependence, cigarettes, uncomplicated: Secondary | ICD-10-CM | POA: Diagnosis present

## 2022-03-18 DIAGNOSIS — R059 Cough, unspecified: Secondary | ICD-10-CM | POA: Insufficient documentation

## 2022-09-15 ENCOUNTER — Other Ambulatory Visit: Payer: Self-pay

## 2022-09-15 ENCOUNTER — Encounter: Payer: Self-pay | Admitting: Emergency Medicine

## 2022-09-15 ENCOUNTER — Ambulatory Visit
Admission: EM | Admit: 2022-09-15 | Discharge: 2022-09-15 | Disposition: A | Discharge status: Home / Self Care | Payer: PPO | Attending: Nurse Practitioner | Admitting: Nurse Practitioner

## 2022-09-15 DIAGNOSIS — Z23 Encounter for immunization: Secondary | ICD-10-CM

## 2022-09-15 DIAGNOSIS — S60512A Abrasion of left hand, initial encounter: Secondary | ICD-10-CM | POA: Diagnosis not present

## 2022-09-15 MED ORDER — TETANUS-DIPHTH-ACELL PERTUSSIS 5-2.5-18.5 LF-MCG/0.5 IM SUSY
0.5000 mL | PREFILLED_SYRINGE | Freq: Once | INTRAMUSCULAR | Status: AC
  Administered 2022-09-15: 0.5 mL via INTRAMUSCULAR

## 2022-09-15 NOTE — Discharge Instructions (Signed)
Your Tdap was updated today.  It is good for the next 10 years. You may keep the dressing in place applied today for the next 24 hours. Close the wound at least twice daily.  You can clean it with warm water.  Recommend applying over-the-counter antibiotic ointment such as Neosporin. May apply ice as needed for pain or swelling. If the area on the back of the left hand becomes swollen develops foul-smelling drainage, you have redness that goes down the hand or up the arm if you develop fever or chills, please follow-up in this clinic or in the emergency department for further evaluation. Follow-up as needed.

## 2022-09-15 NOTE — ED Triage Notes (Addendum)
Pt reports cut left hand on old rusty car yesterday. Pt reports last tetanus x10 years ago. Pt reports cleaned site with alcohol and peroxide last night. Pt noted to have Bandaid at time of triage.   Bandaid removed and small scabbed site noted to posterior left hand.

## 2022-09-15 NOTE — ED Provider Notes (Signed)
[facility]    CSN: [id number] Arrival date & time: 09/15/22  4098      History   Chief Complaint Chief Complaint  Patient presents with   Hand Injury    HPI Robert Montoya is a 67 y.o. male.   The history is provided by the patient.   Patient presents after a injury of the back of the left hand and almost a car 1 day ago.  Patient states that the area bled moderately after the injury, but he has since been able to get it controlled.  He states that he cleaned the area at home with alcohol and peroxide.  He states that he has kept it covered with a Band-Aid.  Patient denies fever, chills, chest pain, abdominal pain, foul-smelling drainage, swelling, numbness or tingling in the left hand.  Patient reports that his last tetanus shot was more than 10 years ago.  Past Medical History:  Diagnosis Date   GERD (gastroesophageal reflux disease)    Hemorrhoids     Patient Active Problem List   Diagnosis Date Noted   IBS (irritable bowel syndrome) 04/02/2021   Lactose intolerance 04/02/2021   Bloating 04/02/2021   External hemorrhoids 04/02/2021   Cholelithiasis 07/17/2020   Tobacco abuse 08/12/2019    Past Surgical History:  Procedure Laterality Date   BIOPSY  08/13/2019   Procedure: BIOPSY;  Surgeon: Malissa Hippo, MD;  Location: [facility];  Service: Endoscopy;;   CARDIAC CATHETERIZATION     COLONOSCOPY N/A 12/05/2013   Procedure: COLONOSCOPY;  Surgeon: Malissa Hippo, MD;  Location: [facility];  Service: Endoscopy;  Laterality: N/A;  1200   COLONOSCOPY N/A 08/13/2019   Procedure: COLONOSCOPY;  Surgeon: Malissa Hippo, MD;  Location: [facility];  Service: Endoscopy;  Laterality: N/A;   POLYPECTOMY  08/13/2019   Procedure: POLYPECTOMY;  Surgeon: Malissa Hippo, MD;  Location: [facility];  Service: Endoscopy;;       Home Medications    Prior to Admission medications   Medication Sig Start Date End Date Taking? Authorizing Provider  albuterol  (VENTOLIN HFA) 108 (90 Base) MCG/ACT inhaler Inhale into the lungs every 6 (six) hours as needed for wheezing or shortness of breath.    [provider]  benzonatate (TESSALON) 100 MG capsule Take 1-2 capsules (100-200 mg total) by mouth 3 (three) times daily as needed for cough. 08/02/21   Wallis Bamberg, PA-C  cyclobenzaprine (FLEXERIL) 10 MG tablet Take 1 tablet (10 mg total) by mouth at bedtime. 11/11/21   Roark Rufo-Warren, Sadie Haber, NP  ibuprofen (ADVIL) 800 MG tablet Take 1 tablet (800 mg total) by mouth every 8 (eight) hours as needed. 11/11/21   Fidelis Loth-Warren, Sadie Haber, NP  lisinopril (ZESTRIL) 20 MG tablet Take 20 mg by mouth daily. 05/28/19   [provider]  predniSONE (DELTASONE) 20 MG tablet Take 2 tablets daily with breakfast. 08/02/21   Wallis Bamberg, PA-C  promethazine-dextromethorphan (PROMETHAZINE-DM) 6.25-15 MG/5ML syrup Take 5 mLs by mouth at bedtime as needed for cough. 08/02/21   Wallis Bamberg, PA-C    Family History Family History  Problem Relation Age of Onset   Colon cancer Maternal Uncle    Colon cancer Paternal Aunt     Social History Social History   Tobacco Use   Smoking status: Former    Packs/day: .25    Types: Cigarettes   Smokeless tobacco: Never  Vaping Use   Vaping Use: Never used  Substance Use Topics  Alcohol use: No   Drug use: Never     Allergies   Patient has no known allergies.   Review of Systems Review of Systems Per HPI  Physical Exam Triage Vital Signs ED Triage Vitals  Enc Vitals Group     BP 09/15/22 0843 (!) 177/82     Pulse Rate 09/15/22 0843 74     Resp 09/15/22 0843 20     Temp 09/15/22 0843 98 F (36.7 C)     Temp Source 09/15/22 0843 Oral     SpO2 09/15/22 0843 94 %     Weight --      Height --      Head Circumference --      Peak Flow --      Pain Score 09/15/22 0844 1     Pain Loc --      Pain Edu? --      Excl. in [place]? --    No data found.  Updated Vital Signs BP (!) 177/82 (BP Location: Right Arm)    Pulse 74   Temp 98 F (36.7 C) (Oral)   Resp 20   SpO2 94%   Visual Acuity Right Eye Distance:   Left Eye Distance:   Bilateral Distance:    Right Eye Near:   Left Eye Near:    Bilateral Near:     Physical Exam Vitals and nursing note reviewed.  Constitutional:      General: He is not in acute distress.    Appearance: Normal appearance.  HENT:     Head: Normocephalic.  Eyes:     Extraocular Movements: Extraocular movements intact.     Pupils: Pupils are equal, round, and reactive to light.  Cardiovascular:     Rate and Rhythm: Normal rate and regular rhythm.     Pulses: Normal pulses.     Heart sounds: Normal heart sounds.  Pulmonary:     Effort: Pulmonary effort is normal.     Breath sounds: Normal breath sounds.  Musculoskeletal:     Cervical back: Normal range of motion.  Skin:    General: Skin is warm and dry.     Comments: Skin abrasion noted to the dorsal aspect of the aspect of the left hand from the first metatarsal.  Localized erythema noted around the base of the abrasion.  No oozing, bleeding, or fluctuance present.  Neurological:     General: No focal deficit present.     Mental Status: He is alert and oriented to person, place, and time.  Psychiatric:        Mood and Affect: Mood normal.        Behavior: Behavior normal.      UC Treatments / Results  Labs (all labs ordered are listed, but only abnormal results are displayed) Labs Reviewed - No data to display  EKG   Radiology No results found.  Procedures Procedures (including critical care time)  Medications Ordered in UC Medications  Tdap (BOOSTRIX) injection 0.5 mL (0.5 mLs Intramuscular Given 09/15/22 0911)    Initial Impression / Assessment and Plan / UC Course  I have reviewed the triage vital signs and the nursing notes.  Pertinent labs & imaging results that were available during my care of the patient were reviewed by me and considered in my medical decision making (see  chart for details).  The patient is well-appearing, he is in no acute distress, vital signs are stable.  Patient with abrasion to the dorsal aspect of of  the left hand.  Tdap was updated today.  Wound was cleansed and dressing was applied.  Supportive care recommendations were provided and discussed with the patient to include continued wound care, and to monitor for infection.  Patient is in agreement with this plan of care and verbalizes understanding.  All questions were answered.  Patient stable for discharge.  Final Clinical Impressions(s) / UC Diagnoses   Final diagnoses:  Abrasion of skin of left hand     Discharge Instructions      Your Tdap was updated today.  It is good for the next 10 years. You may keep the dressing in place applied today for the next 24 hours. Close the wound at least twice daily.  You can clean it with warm water.  Recommend applying over-the-counter antibiotic ointment such as Neosporin. May apply ice as needed for pain or swelling. If the area on the back of the left hand becomes swollen develops foul-smelling drainage, you have redness that goes down the hand or up the arm if you develop fever or chills, please follow-up in this clinic or in the emergency department for further evaluation. Follow-up as needed.     ED Prescriptions   None    PDMP not reviewed this encounter.   Abran Cantor, NP 09/15/22 [phone]

## 2023-04-12 ENCOUNTER — Ambulatory Visit
Admission: RE | Admit: 2023-04-12 | Discharge: 2023-04-12 | Disposition: A | Discharge status: Home / Self Care | Payer: PPO | Source: Ambulatory Visit | Attending: Family Medicine | Admitting: Family Medicine

## 2023-04-12 ENCOUNTER — Ambulatory Visit: Payer: PPO

## 2023-04-12 ENCOUNTER — Telehealth: Payer: Self-pay | Admitting: Family Medicine

## 2023-04-12 ENCOUNTER — Telehealth: Payer: Self-pay

## 2023-04-12 ENCOUNTER — Other Ambulatory Visit (HOSPITAL_COMMUNITY): Payer: Self-pay

## 2023-04-12 VITALS — BP 126/67 | HR 82 | Temp 98.5°F | Resp 16

## 2023-04-12 DIAGNOSIS — R062 Wheezing: Secondary | ICD-10-CM | POA: Diagnosis not present

## 2023-04-12 DIAGNOSIS — R051 Acute cough: Secondary | ICD-10-CM

## 2023-04-12 MED ORDER — BENZONATATE 100 MG PO CAPS: 100.0000 mg | ORAL_CAPSULE | Freq: Three times a day (TID) | ORAL | 0 refills | Status: DC

## 2023-04-12 MED ORDER — PREDNISONE 20 MG PO TABS: 40.0000 mg | ORAL_TABLET | Freq: Every day | ORAL | 0 refills | Status: DC

## 2023-04-12 MED ORDER — PREDNISONE 20 MG PO TABS
40.0000 mg | ORAL_TABLET | Freq: Every day | ORAL | 0 refills | Status: DC
  Filled 2023-04-12: qty 10, 5d supply, fill #0

## 2023-04-12 MED ORDER — ALBUTEROL SULFATE HFA 108 (90 BASE) MCG/ACT IN AERS
2.0000 | INHALATION_SPRAY | RESPIRATORY_TRACT | 0 refills | Status: DC | PRN
  Filled 2023-04-12: qty 6.7, 25d supply, fill #0

## 2023-04-12 MED ORDER — ALBUTEROL SULFATE HFA 108 (90 BASE) MCG/ACT IN AERS: 2.0000 | INHALATION_SPRAY | RESPIRATORY_TRACT | 0 refills | Status: DC | PRN

## 2023-04-12 MED ORDER — BENZONATATE 100 MG PO CAPS
100.0000 mg | ORAL_CAPSULE | Freq: Three times a day (TID) | ORAL | 0 refills | Status: DC
  Filled 2023-04-12: qty 21, 7d supply, fill #0

## 2023-04-12 NOTE — Discharge Instructions (Signed)
We will call once we get your chest x-ray report back and discuss a treatment plan and prescribe medications at that time

## 2023-04-12 NOTE — Telephone Encounter (Signed)
Pt has been contacted with x-ray results, and medication recommendations from provider. Pt decline on cough medication due to his wife having an allergic reaction to the medication. Pt requested an alternative. Provider sent in alternative. Pt verbalized understanding.

## 2023-04-12 NOTE — Telephone Encounter (Signed)
Pt wanted medications sent to [facility] on [address].

## 2023-04-12 NOTE — ED Triage Notes (Signed)
Pt reports deep cough x 4 days.

## 2023-04-12 NOTE — Telephone Encounter (Signed)
Medications sent in for bronchitis.  Patient notified

## 2023-04-12 NOTE — ED Provider Notes (Signed)
[facility]    CSN: [id number] Arrival date & time: 04/12/23  7846      History   Chief Complaint Chief Complaint  Patient presents with   URI    Deep cough - Entered by patient    HPI Robert Montoya is a 67 y.o. male.   Patient presenting today with 4-day history of congestion, productive deep cough, chest tightness.  Denies fever, chills, chest pain, shortness of breath, abdominal pain, nausea vomiting or diarrhea.  So far trying over-the-counter remedies with minimal relief.  Wife sick with similar symptoms.  No known history of chronic pulmonary disease.    Past Medical History:  Diagnosis Date   GERD (gastroesophageal reflux disease)    Hemorrhoids     Patient Active Problem List   Diagnosis Date Noted   IBS (irritable bowel syndrome) 04/02/2021   Lactose intolerance 04/02/2021   Bloating 04/02/2021   External hemorrhoids 04/02/2021   Cholelithiasis 07/17/2020   Tobacco abuse 08/12/2019    Past Surgical History:  Procedure Laterality Date   BIOPSY  08/13/2019   Procedure: BIOPSY;  Surgeon: Malissa Hippo, MD;  Location: [facility];  Service: Endoscopy;;   CARDIAC CATHETERIZATION     COLONOSCOPY N/A 12/05/2013   Procedure: COLONOSCOPY;  Surgeon: Malissa Hippo, MD;  Location: [facility];  Service: Endoscopy;  Laterality: N/A;  1200   COLONOSCOPY N/A 08/13/2019   Procedure: COLONOSCOPY;  Surgeon: Malissa Hippo, MD;  Location: [facility];  Service: Endoscopy;  Laterality: N/A;   POLYPECTOMY  08/13/2019   Procedure: POLYPECTOMY;  Surgeon: Malissa Hippo, MD;  Location: [facility];  Service: Endoscopy;;       Home Medications    Prior to Admission medications   Medication Sig Start Date End Date Taking? Authorizing Provider  albuterol (VENTOLIN HFA) 108 (90 Base) MCG/ACT inhaler Inhale into the lungs every 6 (six) hours as needed for wheezing or shortness of breath.    [provider]  albuterol (VENTOLIN HFA) 108 (90  Base) MCG/ACT inhaler Inhale 2 puffs into the lungs every 4 (four) hours as needed. 04/12/23   Particia Nearing, PA-C  benzonatate (TESSALON) 100 MG capsule Take 1-2 capsules (100-200 mg total) by mouth 3 (three) times daily as needed for cough. 08/02/21   Wallis Bamberg, PA-C  benzonatate (TESSALON) 100 MG capsule Take 1 capsule (100 mg total) by mouth every 8 (eight) hours. 04/12/23   Particia Nearing, PA-C  cyclobenzaprine (FLEXERIL) 10 MG tablet Take 1 tablet (10 mg total) by mouth at bedtime. 11/11/21   Leath-Warren, Sadie Haber, NP  ibuprofen (ADVIL) 800 MG tablet Take 1 tablet (800 mg total) by mouth every 8 (eight) hours as needed. 11/11/21   Leath-Warren, Sadie Haber, NP  lisinopril (ZESTRIL) 20 MG tablet Take 20 mg by mouth daily. 05/28/19   [provider]  predniSONE (DELTASONE) 20 MG tablet Take 2 tablets daily with breakfast. 08/02/21   Wallis Bamberg, PA-C  predniSONE (DELTASONE) 20 MG tablet Take 2 tablets (40 mg total) by mouth daily with breakfast for 5 days 04/12/23   Particia Nearing, PA-C  promethazine-dextromethorphan (PROMETHAZINE-DM) 6.25-15 MG/5ML syrup Take 5 mLs by mouth at bedtime as needed for cough. 08/02/21   Wallis Bamberg, PA-C    Family History Family History  Problem Relation Age of Onset   Colon cancer Maternal Uncle    Colon cancer Paternal Aunt     Social History Social History   Tobacco Use  Smoking status: Former    Current packs/day: 0.25    Types: Cigarettes   Smokeless tobacco: Never  Vaping Use   Vaping status: Never Used  Substance Use Topics   Alcohol use: No   Drug use: Never     Allergies   Patient has no known allergies.   Review of Systems Review of Systems Per HPI  Physical Exam Triage Vital Signs ED Triage Vitals  Encounter Vitals Group     BP 04/12/23 0938 126/67     Systolic BP Percentile --      Diastolic BP Percentile --      Pulse Rate 04/12/23 0938 82     Resp 04/12/23 0938 16     Temp 04/12/23 0938  98.5 F (36.9 C)     Temp Source 04/12/23 0938 Oral     SpO2 04/12/23 0938 94 %     Weight --      Height --      Head Circumference --      Peak Flow --      Pain Score 04/12/23 0941 0     Pain Loc --      Pain Education --      Exclude from Growth Chart --    No data found.  Updated Vital Signs BP 126/67 (BP Location: Right Arm)   Pulse 82   Temp 98.5 F (36.9 C) (Oral)   Resp 16   SpO2 94%   Visual Acuity Right Eye Distance:   Left Eye Distance:   Bilateral Distance:    Right Eye Near:   Left Eye Near:    Bilateral Near:     Physical Exam Vitals and nursing note reviewed.  Constitutional:      Appearance: Normal appearance.  HENT:     Head: Atraumatic.     Right Ear: Tympanic membrane normal.     Left Ear: Tympanic membrane normal.     Nose: Rhinorrhea present.     Mouth/Throat:     Mouth: Mucous membranes are moist.     Pharynx: Oropharynx is clear. Posterior oropharyngeal erythema present.  Eyes:     Extraocular Movements: Extraocular movements intact.     Conjunctiva/sclera: Conjunctivae normal.  Cardiovascular:     Rate and Rhythm: Normal rate and regular rhythm.     Heart sounds: Normal heart sounds.  Pulmonary:     Effort: Pulmonary effort is normal.     Breath sounds: Normal breath sounds. No wheezing or rales.  Musculoskeletal:        General: Normal range of motion.     Cervical back: Normal range of motion and neck supple.  Skin:    General: Skin is warm and dry.  Neurological:     General: No focal deficit present.     Mental Status: He is oriented to person, place, and time.  Psychiatric:        Mood and Affect: Mood normal.        Thought Content: Thought content normal.        Judgment: Judgment normal.      UC Treatments / Results  Labs (all labs ordered are listed, but only abnormal results are displayed) Labs Reviewed - No data to display  EKG   Radiology DG Chest 2 View  Result Date: 04/12/2023 CLINICAL DATA:   Hacking cough with wheezing for 3 days. EXAM: CHEST - 2 VIEW COMPARISON:  Radiographs 03/18/2022 and 08/02/2021. FINDINGS: The heart size and mediastinal contours are stable with  aortic atherosclerosis. There is no edema, confluent airspace disease, pleural effusion or pneumothorax. Possible mild central airway thickening versus technical differences. There are stable degenerative changes in the spine without evidence of acute osseous abnormality. IMPRESSION: Possible mild central airway thickening versus technical differences, potentially bronchitis. No evidence of pneumonia. Electronically Signed   By: Carey Bullocks M.D.   On: 04/12/2023 10:59    Procedures Procedures (including critical care time)  Medications Ordered in UC Medications - No data to display  Initial Impression / Assessment and Plan / UC Course  I have reviewed the triage vital signs and the nursing notes.  Pertinent labs & imaging results that were available during my care of the patient were reviewed by me and considered in my medical decision making (see chart for details).     Vitals and exam overall reassuring today, suspect viral bronchitis.  Chest x-ray negative for pneumonia.  Treat with prednisone, albuterol, cough medication and supportive home care.  Return for worsening symptoms. Final Clinical Impressions(s) / UC Diagnoses   Final diagnoses:  Acute cough  Wheezing     Discharge Instructions      We will call once we get your chest x-ray report back and discuss a treatment plan and prescribe medications at that time    ED Prescriptions   None    PDMP not reviewed this encounter.   Particia Nearing, [place] 04/12/23 [phone]

## 2023-04-18 ENCOUNTER — Ambulatory Visit
Admission: RE | Admit: 2023-04-18 | Discharge: 2023-04-18 | Disposition: A | Discharge status: Home / Self Care | Payer: PPO | Source: Ambulatory Visit | Attending: Nurse Practitioner | Admitting: Nurse Practitioner

## 2023-04-18 VITALS — BP 121/72 | HR 96 | Temp 98.3°F | Resp 17

## 2023-04-18 DIAGNOSIS — J069 Acute upper respiratory infection, unspecified: Secondary | ICD-10-CM

## 2023-04-18 DIAGNOSIS — R062 Wheezing: Secondary | ICD-10-CM | POA: Diagnosis not present

## 2023-04-18 DIAGNOSIS — Z87891 Personal history of nicotine dependence: Secondary | ICD-10-CM

## 2023-04-18 MED ORDER — AZITHROMYCIN 250 MG PO TABS: ORAL_TABLET | ORAL | 0 refills | Status: AC

## 2023-04-18 MED ORDER — METHYLPREDNISOLONE ACETATE 40 MG/ML IJ SUSP
40.0000 mg | Freq: Once | INTRAMUSCULAR | Status: AC
  Administered 2023-04-18: 40 mg via INTRAMUSCULAR

## 2023-04-18 MED ORDER — ALBUTEROL SULFATE HFA 108 (90 BASE) MCG/ACT IN AERS: 2.0000 | INHALATION_SPRAY | RESPIRATORY_TRACT | 0 refills | Status: DC | PRN

## 2023-04-18 MED ORDER — IPRATROPIUM-ALBUTEROL 0.5-2.5 (3) MG/3ML IN SOLN
3.0000 mL | Freq: Once | RESPIRATORY_TRACT | Status: AC
  Administered 2023-04-18: 3 mL via RESPIRATORY_TRACT

## 2023-04-18 NOTE — Discharge Instructions (Signed)
We gave you a breathing treatment and a steroid injection today to help with your breathing.  Continue to use the albuterol inhaler every 4-6 hours as needed for wheezing or shortness of breath.  In addition, take the azithromycin as prescribed to treat for infection in your lungs.  Continue Tessalon Perles as needed for cough and Mucinex to help work up the congestion.  Seek care if symptoms do not improve with treatment.

## 2023-04-18 NOTE — ED Triage Notes (Signed)
Pt reports cough and wheezing x 1 week , was seen on 04/12/2023 with acute cough. Pt states it comes and goes.

## 2023-04-18 NOTE — ED Provider Notes (Signed)
[facility]    CSN: [id number] Arrival date & time: 04/18/23  0920      History   Chief Complaint Chief Complaint  Patient presents with   Wheezing    Coughing, yellow mucus - Entered by patient    HPI Robert Montoya is a 67 y.o. male.   Patient presents today with 6-day history of cough.  Reports he was seen on 04/12/2023, was treated with oral prednisone and cough suppressant medication which seem to help initially.  He reports the pharmacy never filled his albuterol inhaler.  Reports symptoms acutely worsened last night.  He denies new fever, chills, but is aching all over from coughing so much.  He is also coughing up thick, yellow mucus and has some shortness of breath and wheezing.  No chest pain, new runny or stuffy nose, sore throat, ear pain, abdominal pain, nausea/vomiting, or diarrhea.  No worsening fatigue.  Reports he does have a headache from coughing so much.  Patient reports history of smoking, quit a few months ago.  Reports he smoked more than 40 years about 1 pack/day.  Has history of wheezing.  No formal diagnosis of COPD or emphysema.    Past Medical History:  Diagnosis Date   GERD (gastroesophageal reflux disease)    Hemorrhoids     Patient Active Problem List   Diagnosis Date Noted   IBS (irritable bowel syndrome) 04/02/2021   Lactose intolerance 04/02/2021   Bloating 04/02/2021   External hemorrhoids 04/02/2021   Cholelithiasis 07/17/2020   Tobacco abuse 08/12/2019    Past Surgical History:  Procedure Laterality Date   BIOPSY  08/13/2019   Procedure: BIOPSY;  Surgeon: Malissa Hippo, MD;  Location: [facility];  Service: Endoscopy;;   CARDIAC CATHETERIZATION     COLONOSCOPY N/A 12/05/2013   Procedure: COLONOSCOPY;  Surgeon: Malissa Hippo, MD;  Location: [facility];  Service: Endoscopy;  Laterality: N/A;  1200   COLONOSCOPY N/A 08/13/2019   Procedure: COLONOSCOPY;  Surgeon: Malissa Hippo, MD;  Location: [facility];   Service: Endoscopy;  Laterality: N/A;   POLYPECTOMY  08/13/2019   Procedure: POLYPECTOMY;  Surgeon: Malissa Hippo, MD;  Location: [facility];  Service: Endoscopy;;       Home Medications    Prior to Admission medications   Medication Sig Start Date End Date Taking? Authorizing Provider  azithromycin (ZITHROMAX) 250 MG tablet Take (2) tablets by mouth on day 1, then take (1) tablet by mouth on days 2-5. 04/18/23  Yes Valentino Nose, NP  albuterol (VENTOLIN HFA) 108 (90 Base) MCG/ACT inhaler Inhale 2 puffs into the lungs every 4 (four) hours as needed. 04/18/23   Valentino Nose, NP  benzonatate (TESSALON) 100 MG capsule Take 1-2 capsules (100-200 mg total) by mouth 3 (three) times daily as needed for cough. 08/02/21   Wallis Bamberg, PA-C  cyclobenzaprine (FLEXERIL) 10 MG tablet Take 1 tablet (10 mg total) by mouth at bedtime. 11/11/21   Leath-Warren, Sadie Haber, NP  ibuprofen (ADVIL) 800 MG tablet Take 1 tablet (800 mg total) by mouth every 8 (eight) hours as needed. 11/11/21   Leath-Warren, Sadie Haber, NP  lisinopril (ZESTRIL) 20 MG tablet Take 20 mg by mouth daily. 05/28/19   [provider]    Family History Family History  Problem Relation Age of Onset   Colon cancer Maternal Uncle    Colon cancer Paternal Aunt     Social History Social History   Tobacco  Use   Smoking status: Former    Current packs/day: 0.25    Types: Cigarettes   Smokeless tobacco: Never  Vaping Use   Vaping status: Never Used  Substance Use Topics   Alcohol use: No   Drug use: Never     Allergies   Patient has no known allergies.   Review of Systems Review of Systems Per HPI  Physical Exam Triage Vital Signs ED Triage Vitals  Encounter Vitals Group     BP 04/18/23 0958 121/72     Systolic BP Percentile --      Diastolic BP Percentile --      Pulse Rate 04/18/23 0958 96     Resp 04/18/23 0958 17     Temp 04/18/23 0958 98.3 F (36.8 C)     Temp Source 04/18/23 0958  Oral     SpO2 04/18/23 0958 93 %     Weight --      Height --      Head Circumference --      Peak Flow --      Pain Score 04/18/23 1000 0     Pain Loc --      Pain Education --      Exclude from Growth Chart --    No data found.  Updated Vital Signs BP 121/72 (BP Location: Right Arm)   Pulse 96   Temp 98.3 F (36.8 C) (Oral)   Resp 17   SpO2 93%   SpO2 recheck after Duoneb: 98% room air  Visual Acuity Right Eye Distance:   Left Eye Distance:   Bilateral Distance:    Right Eye Near:   Left Eye Near:    Bilateral Near:     Physical Exam Vitals and nursing note reviewed.  Constitutional:      General: He is not in acute distress.    Appearance: Normal appearance. He is not ill-appearing or toxic-appearing.  HENT:     Head: Normocephalic and atraumatic.     Right Ear: Tympanic membrane, ear canal and external ear normal.     Left Ear: Tympanic membrane, ear canal and external ear normal.     Nose: No congestion or rhinorrhea.     Mouth/Throat:     Mouth: Mucous membranes are moist.     Pharynx: Oropharynx is clear. Posterior oropharyngeal erythema present. No oropharyngeal exudate.  Eyes:     General: No scleral icterus.    Extraocular Movements: Extraocular movements intact.  Cardiovascular:     Rate and Rhythm: Normal rate and regular rhythm.  Pulmonary:     Effort: Pulmonary effort is normal. No respiratory distress.     Breath sounds: Wheezing and rhonchi present. No rales.  Musculoskeletal:     Cervical back: Normal range of motion and neck supple.  Lymphadenopathy:     Cervical: No cervical adenopathy.  Skin:    General: Skin is warm and dry.     Coloration: Skin is not jaundiced or pale.     Findings: No erythema or rash.  Neurological:     Mental Status: He is alert and oriented to person, place, and time.  Psychiatric:        Behavior: Behavior is cooperative.      UC Treatments / Results  Labs (all labs ordered are listed, but only  abnormal results are displayed) Labs Reviewed - No data to display  EKG   Radiology No results found.  Procedures Procedures (including critical care time)  Medications Ordered in  UC Medications  ipratropium-albuterol (DUONEB) 0.5-2.5 (3) MG/3ML nebulizer solution 3 mL (3 mLs Nebulization Given 04/18/23 1043)  methylPREDNISolone acetate (DEPO-MEDROL) injection 40 mg (40 mg Intramuscular Given 04/18/23 1043)    Initial Impression / Assessment and Plan / UC Course  I have reviewed the triage vital signs and the nursing notes.  Pertinent labs & imaging results that were available during my care of the patient were reviewed by me and considered in my medical decision making (see chart for details).   Patient is well-appearing, normotensive, afebrile, not tachycardic, not tachypneic, oxygenating well on room air.  After DuoNeb breathing treatment, oxygenation increased to 98% on room air from 93% on room air.  1. Acute upper respiratory infection 2. Wheezing 3. History of smoking 30 or more pack years Overall, vitals and exam are reassuring Will defer repeating chest x-ray at this time as he had 1 completed last week and it was negative DuoNeb given with significant improvement in wheezing and rhonchi Recommended continuing albuterol inhaler at home, resent to pharmacy We also treated lung inflammation with Depo-Medrol IM in urgent care, do not think we need to do another prednisone burst at this time given much improvement in wheezing after breathing treatment Will cover for atypical lung infection with azithromycin  Return and ER precautions discussed with patient  The patient was given the opportunity to ask questions.  All questions answered to their satisfaction.  The patient is in agreement to this plan.    Final Clinical Impressions(s) / UC Diagnoses   Final diagnoses:  Acute upper respiratory infection  Wheezing  History of smoking 30 or more pack years     Discharge  Instructions      We gave you a breathing treatment and a steroid injection today to help with your breathing.  Continue to use the albuterol inhaler every 4-6 hours as needed for wheezing or shortness of breath.  In addition, take the azithromycin as prescribed to treat for infection in your lungs.  Continue Tessalon Perles as needed for cough and Mucinex to help work up the congestion.  Seek care if symptoms do not improve with treatment.     ED Prescriptions     Medication Sig Dispense Auth. Provider   albuterol (VENTOLIN HFA) 108 (90 Base) MCG/ACT inhaler Inhale 2 puffs into the lungs every 4 (four) hours as needed. 8 g Cathlean Marseilles A, NP   azithromycin (ZITHROMAX) 250 MG tablet Take (2) tablets by mouth on day 1, then take (1) tablet by mouth on days 2-5. 6 tablet Valentino Nose, NP      PDMP not reviewed this encounter.   Valentino Nose, NP 04/18/23 1120

## 2023-05-05 ENCOUNTER — Other Ambulatory Visit: Payer: Self-pay | Admitting: Nurse Practitioner

## 2023-10-17 ENCOUNTER — Ambulatory Visit
Admission: EM | Admit: 2023-10-17 | Discharge: 2023-10-17 | Disposition: A | Discharge status: Home / Self Care | Attending: Nurse Practitioner | Admitting: Nurse Practitioner

## 2023-10-17 DIAGNOSIS — S0502XA Injury of conjunctiva and corneal abrasion without foreign body, left eye, initial encounter: Secondary | ICD-10-CM | POA: Diagnosis not present

## 2023-10-17 MED ORDER — POLYMYXIN B-TRIMETHOPRIM 10000-0.1 UNIT/ML-% OP SOLN: 1.0000 [drp] | Freq: Four times a day (QID) | OPHTHALMIC | 0 refills | Status: AC

## 2023-10-17 NOTE — ED Triage Notes (Signed)
 Pt reports he feels like some jigsaw dust is in his left eye x 3 hrs ago. Was wearing safety goggles but it was also a fan running.

## 2023-10-17 NOTE — Discharge Instructions (Signed)
 Use eyedrops as prescribed.   You may take over-the-counter Tylenol  as needed for pain or discomfort. Apply warm compresses to the eye to help with pain or discomfort. Cool compresses to the eyes to help with pain or swelling. You may use over-the-counter Visine or Clear Eyes eyedrops to help keep the eye moist and lubricated. Strict handwashing when applying medication.  Avoid rubbing or manipulating the eyes while symptoms persist. If your symptoms fail to improve with this treatment, please follow-up with your eye doctor for further evaluation. Follow-up as needed.

## 2023-10-17 NOTE — ED Provider Notes (Signed)
 [facility]    CSN: [id number] Arrival date & time: 10/17/23  1511      History   Chief Complaint No chief complaint on file.   HPI Robert Montoya is a 68 y.o. male.   The history is provided by the patient.   Patient presents for complaints of left eye tearing and feeling like there is something in the left eye.  Patient states he was just sawing when something flew into his eye.  Patient states it may have been dust or metal.  Symptoms started approximately 3 hours ago.  Patient states he was wearing safety goggles, but also had a fan running in the background.  Patient states that the eye has been tearing.  Denies fever, chills, visual changes, headache, blurred vision, eye swelling, light sensitivity, or purulent drainage.  Patient states he does not wear contacts or glasses.  Past Medical History:  Diagnosis Date   GERD (gastroesophageal reflux disease)    Hemorrhoids     Patient Active Problem List   Diagnosis Date Noted   IBS (irritable bowel syndrome) 04/02/2021   Lactose intolerance 04/02/2021   Bloating 04/02/2021   External hemorrhoids 04/02/2021   Cholelithiasis 07/17/2020   Tobacco abuse 08/12/2019    Past Surgical History:  Procedure Laterality Date   BIOPSY  08/13/2019   Procedure: BIOPSY;  Surgeon: Ruby Corporal, MD;  Location: [facility];  Service: Endoscopy;;   CARDIAC CATHETERIZATION     COLONOSCOPY N/A 12/05/2013   Procedure: COLONOSCOPY;  Surgeon: Ruby Corporal, MD;  Location: [facility];  Service: Endoscopy;  Laterality: N/A;  1200   COLONOSCOPY N/A 08/13/2019   Procedure: COLONOSCOPY;  Surgeon: Ruby Corporal, MD;  [facility]: [facility];  Service: Endoscopy;  Laterality: N/A;   POLYPECTOMY  08/13/2019   Procedure: POLYPECTOMY;  Surgeon: Ruby Corporal, MD;  [facility]: [facility];  Service: Endoscopy;;       Home Medications    Prior to Admission medications   Medication Sig Start Date End Date Taking?  Authorizing Provider  albuterol  (VENTOLIN  HFA) 108 (90 Base) MCG/ACT inhaler Inhale 2 puffs into the lungs every 4 (four) hours as needed. 04/18/23   Wilhemena Harbour, NP  azithromycin  (ZITHROMAX ) 250 MG tablet Take (2) tablets by mouth on day 1, then take (1) tablet by mouth on days 2-5. 04/18/23   Wilhemena Harbour, NP  benzonatate  (TESSALON ) 100 MG capsule Take 1-2 capsules (100-200 mg total) by mouth 3 (three) times daily as needed for cough. 08/02/21   Adolph Hoop, PA-C  cyclobenzaprine  (FLEXERIL ) 10 MG tablet Take 1 tablet (10 mg total) by mouth at bedtime. 11/11/21   Leath-Warren, Belen Bowers, NP  ibuprofen  (ADVIL ) 800 MG tablet Take 1 tablet (800 mg total) by mouth every 8 (eight) hours as needed. 11/11/21   Leath-Warren, Belen Bowers, NP  lisinopril  (ZESTRIL ) 20 MG tablet Take 20 mg by mouth daily. 05/28/19   [provider]    Family History Family History  Problem Relation Age of Onset   Colon cancer Maternal Uncle    Colon cancer Paternal Aunt     Social History Social History   Tobacco Use   Smoking status: Former    Current packs/day: 0.25    Types: Cigarettes   Smokeless tobacco: Never  Vaping Use   Vaping status: Never Used  Substance Use Topics   Alcohol use: No   Drug use: Never     Allergies   Patient has  no known allergies.   Review of Systems Review of Systems Per HPI  Physical Exam Triage Vital Signs ED Triage Vitals [10/17/23 1633]  Encounter Vitals Group     BP (!) 146/80     Girls Systolic BP Percentile      Girls Diastolic BP Percentile      Boys Systolic BP Percentile      Boys Diastolic BP Percentile      Pulse Rate 91     Resp 20     Temp 98 F (36.7 C)     Temp Source Oral     SpO2 94 %     Weight      Height      Head Circumference      Peak Flow      Pain Score 4     Pain Loc      Pain Education      Exclude from Growth Chart    No data found.  Updated Vital Signs BP (!) 146/80 (BP Location: Right Arm)   Pulse  91   Temp 98 F (36.7 C) (Oral)   Resp 20   SpO2 94%   Visual Acuity Right Eye Distance: 20/50 Left Eye Distance: 20/50 Bilateral Distance: could not obtain pt states eyes were watering to bad  Right Eye Near:   Left Eye Near:    Bilateral Near:     Physical Exam Vitals and nursing note reviewed.  Constitutional:      General: He is not in acute distress.    Appearance: Normal appearance.  HENT:     Head: Normocephalic.   Eyes:     General: Lids are normal. Vision grossly intact. No visual field deficit.       Left eye: No foreign body or discharge.     Extraocular Movements: Extraocular movements intact.     Conjunctiva/sclera:     Left eye: Left conjunctiva is not injected. No chemosis, exudate or hemorrhage.    Pupils: Pupils are equal, round, and reactive to light.     Comments: Eye Exam: Eyelids everted and swept for foreign body. The eye was anesthetized with 2 drops of Tetracaine and stained with fluorescein. Examination under woods lamp does not reveal a foreign body or area of increased stain uptake. The eye was then irrigated copiously with saline.   Pulmonary:     Effort: Pulmonary effort is normal.   Musculoskeletal:     Cervical back: Normal range of motion.   Skin:    General: Skin is warm and dry.   Neurological:     General: No focal deficit present.     Mental Status: He is alert and oriented to person, place, and time.   Psychiatric:        Mood and Affect: Mood normal.        Behavior: Behavior normal.      UC Treatments / Results  Labs (all labs ordered are listed, but only abnormal results are displayed) Labs Reviewed - No data to display  EKG   Radiology No results found.  Procedures Procedures (including critical care time)  Medications Ordered in UC Medications - No data to display  Initial Impression / Assessment and Plan / UC Course  I have reviewed the triage vital signs and the nursing notes.  Pertinent labs &  imaging results that were available during my care of the patient were reviewed by me and considered in my medical decision making (see chart for details).  Fluorescein stain performed, no obvious uptake seen on exam.  Will treat with Polytrim eyedrops.  Supportive care recommendations were provided and discussed with the patient to include over-the-counter analgesics, warm compresses, and avoiding rubbing or manipulating the eyes while symptoms persist.  Patient was given indications to follow-up with ophthalmology if symptoms fail to improve with this treatment.  Patient was in agreement with this plan of care and verbalizes understanding.  All questions were answered.  Patient stable for discharge.  Final Clinical Impressions(s) / UC Diagnoses   Final diagnoses:  None   Discharge Instructions   None    ED Prescriptions   None    PDMP not reviewed this encounter.   Hardy Lia, NP 10/17/23 1705

## 2023-11-01 IMAGING — DX DG CHEST 2V
2 series · 2 of 2 positions shown · non-contrast
Comparison: 07/07/2021

CLINICAL DATA: Cough and shortness of breath.

EXAM:
CHEST - 2 VIEW

[chest pa]
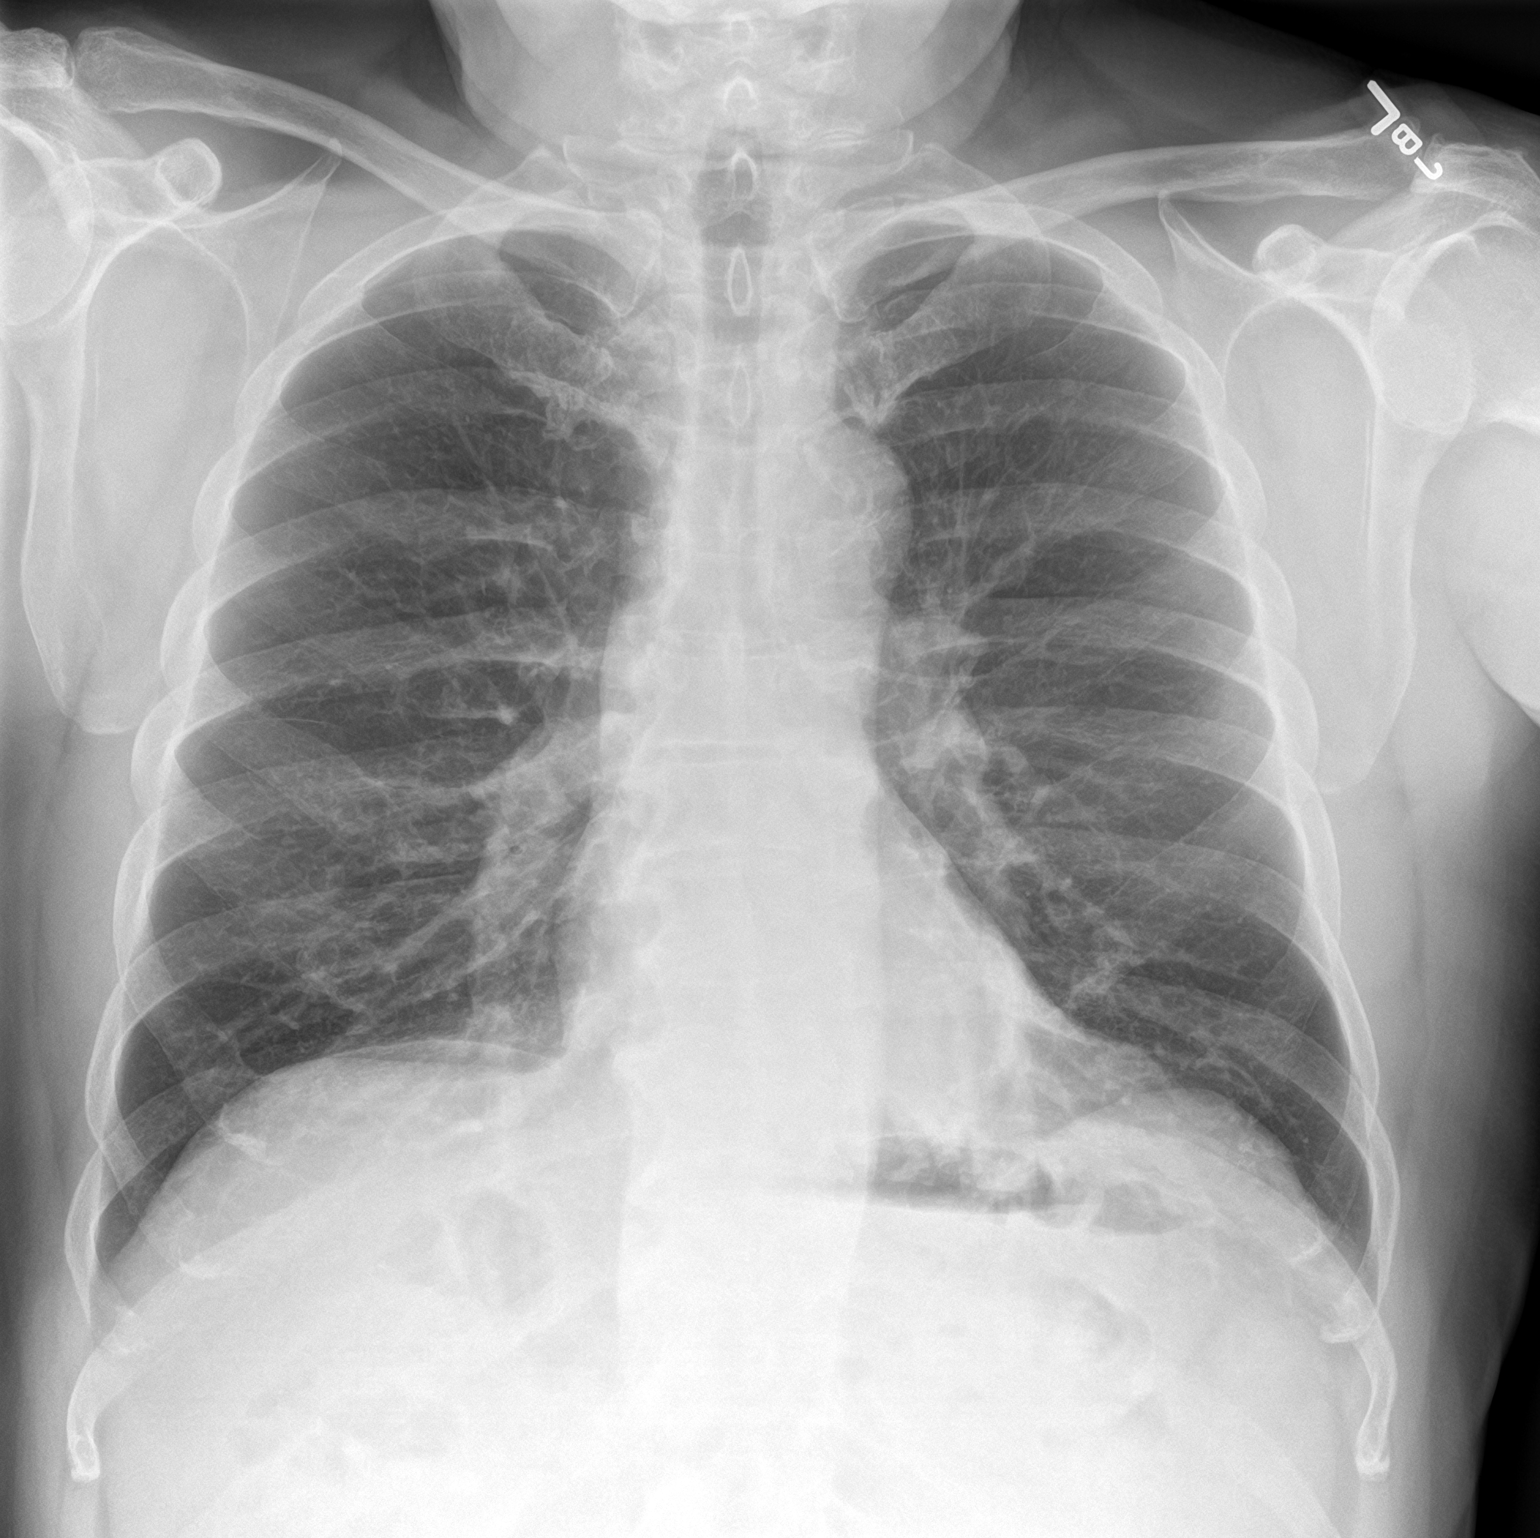

[chest lat]
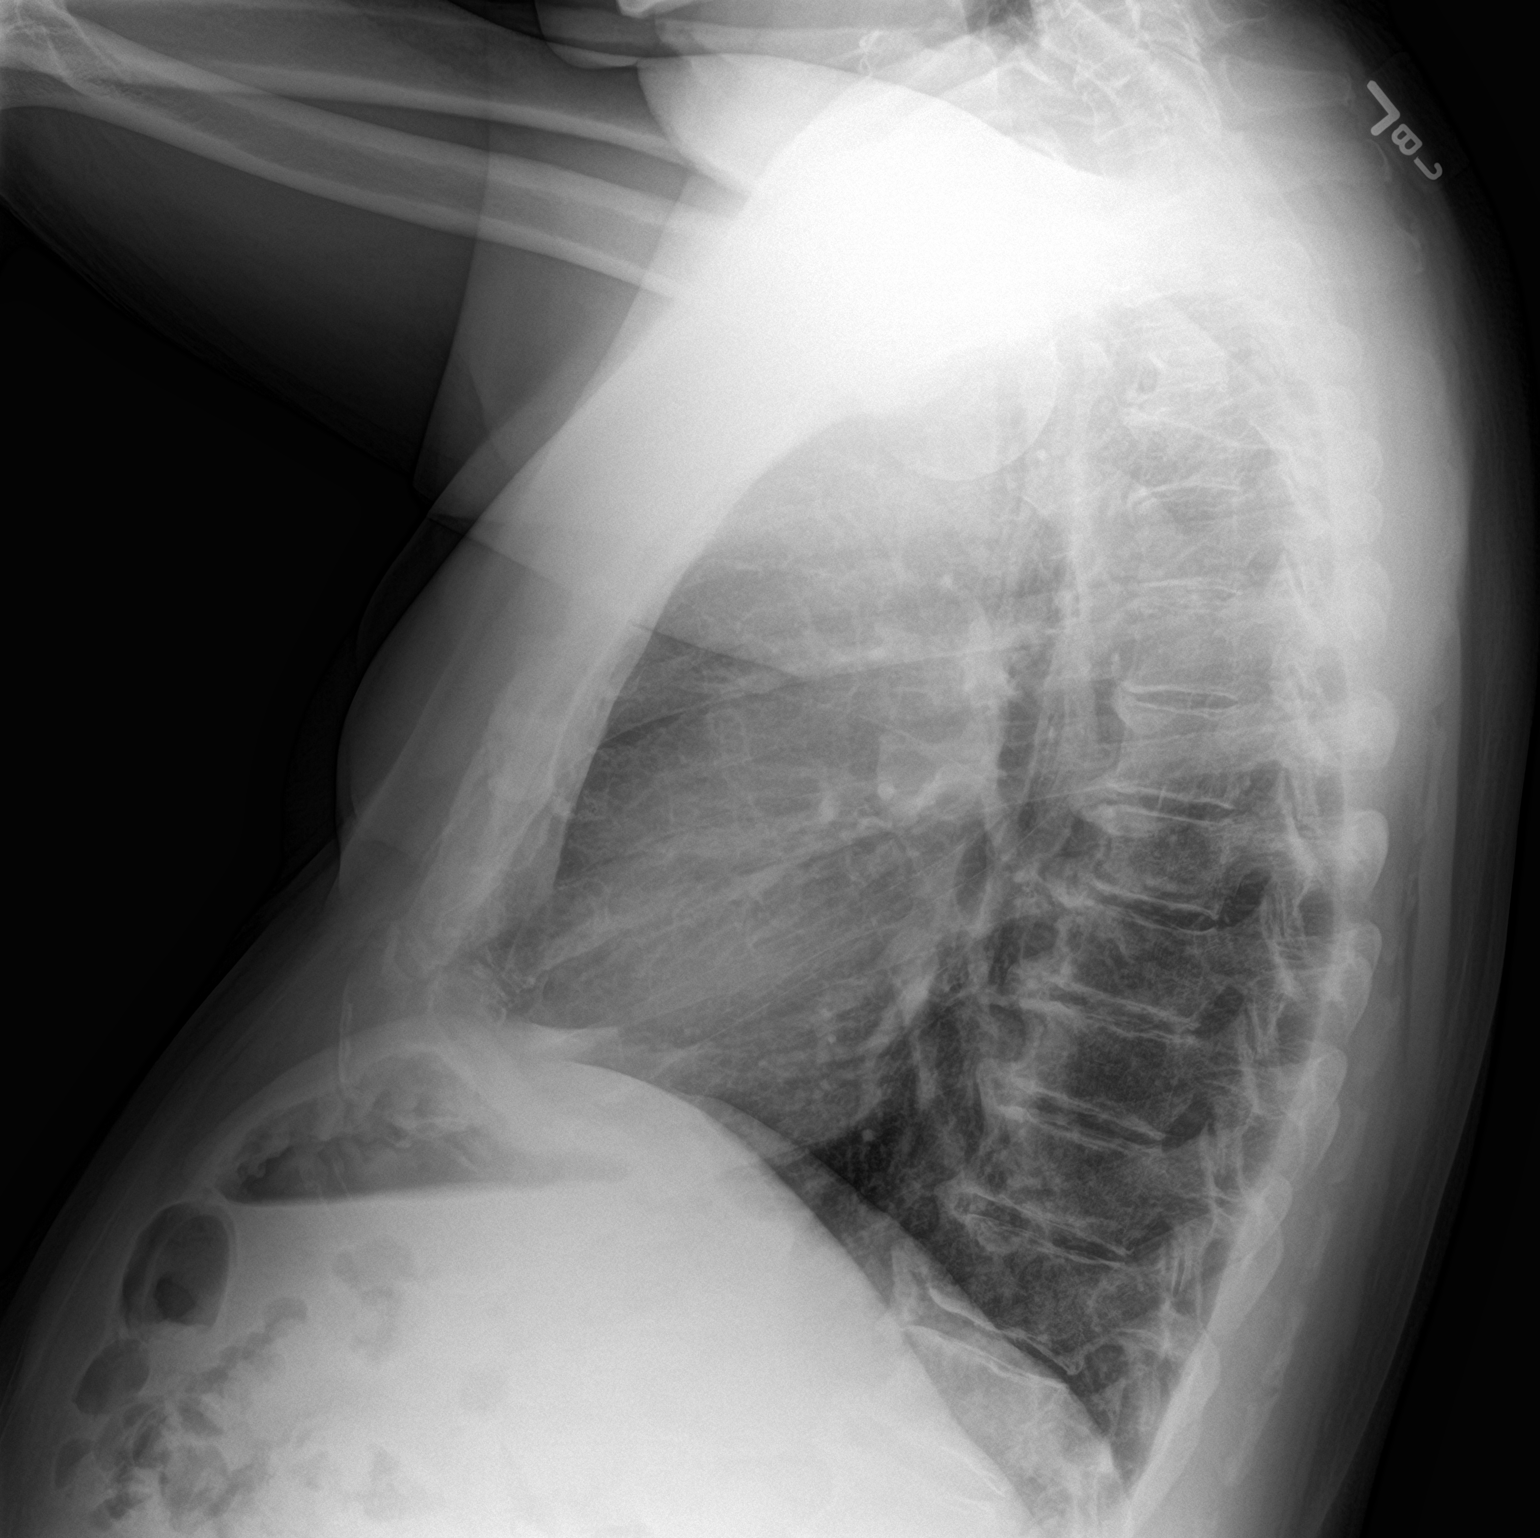

[2 of 2 positions shown; findings below may reference images not displayed]

FINDINGS: The lungs are clear without focal pneumonia, edema, pneumothorax or
pleural effusion. The cardiopericardial silhouette is within normal
limits for size. The visualized bony structures of the thorax are
unremarkable.
IMPRESSION: No active cardiopulmonary disease.

## 2024-03-28 ENCOUNTER — Ambulatory Visit: Admit: 2024-03-28

## 2024-03-28 ENCOUNTER — Ambulatory Visit
Admission: EM | Admit: 2024-03-28 | Discharge: 2024-03-28 | Disposition: A | Discharge status: Home / Self Care | Attending: Nurse Practitioner | Admitting: Nurse Practitioner

## 2024-03-28 DIAGNOSIS — J029 Acute pharyngitis, unspecified: Secondary | ICD-10-CM | POA: Diagnosis not present

## 2024-03-28 DIAGNOSIS — J069 Acute upper respiratory infection, unspecified: Secondary | ICD-10-CM | POA: Diagnosis not present

## 2024-03-28 LAB — POC SOFIA SARS ANTIGEN FIA: SARS Coronavirus 2 Ag: NEGATIVE

## 2024-03-28 LAB — POCT RAPID STREP A (OFFICE): Rapid Strep A Screen: NEGATIVE

## 2024-03-28 MED ORDER — CETIRIZINE HCL 10 MG PO TABS: 10.0000 mg | ORAL_TABLET | Freq: Every day | ORAL | 0 refills | Status: AC

## 2024-03-28 MED ORDER — FLUTICASONE PROPIONATE 50 MCG/ACT NA SUSP: 2.0000 | Freq: Every day | NASAL | 0 refills | Status: AC

## 2024-03-28 NOTE — ED Triage Notes (Signed)
 Sore throat, sinus drainage x 1 day. Taking aleve.

## 2024-03-28 NOTE — ED Provider Notes (Signed)
 [facility]    CSN: [id number] Arrival date & time: 03/28/24  0804      History   Chief Complaint Chief Complaint  Patient presents with   Sore Throat    HPI Robert Montoya is a 68 y.o. male.   The history is provided by the patient.   Patient presents with a 1 day history of sore throat and postnasal drainage.  Patient states symptoms started over the past 24 hours.  Denies fever, chills, headache, ear pain, nasal congestion, runny nose, cough, abdominal pain, nausea, vomiting, diarrhea, or rash.  Patient denies any obvious close sick contacts.  States so far, he did take an Aleve this morning. Past Medical History:  Diagnosis Date   GERD (gastroesophageal reflux disease)    Hemorrhoids     Patient Active Problem List   Diagnosis Date Noted   IBS (irritable bowel syndrome) 04/02/2021   Lactose intolerance 04/02/2021   Bloating 04/02/2021   External hemorrhoids 04/02/2021   Cholelithiasis 07/17/2020   Tobacco abuse 08/12/2019    Past Surgical History:  Procedure Laterality Date   BIOPSY  08/13/2019   Procedure: BIOPSY;  Surgeon: Golda Claudis PENNER, MD;  Location: [facility];  Service: Endoscopy;;   CARDIAC CATHETERIZATION     COLONOSCOPY N/A 12/05/2013   Procedure: COLONOSCOPY;  Surgeon: Claudis PENNER Golda, MD;  Location: [facility];  Service: Endoscopy;  Laterality: N/A;  1200   COLONOSCOPY N/A 08/13/2019   Procedure: COLONOSCOPY;  Surgeon: Golda Claudis PENNER, MD;  Location: [facility];  Service: Endoscopy;  Laterality: N/A;   POLYPECTOMY  08/13/2019   Procedure: POLYPECTOMY;  Surgeon: Golda Claudis PENNER, MD;  Location: [facility];  Service: Endoscopy;;       Home Medications    Prior to Admission medications   Medication Sig Start Date End Date Taking? Authorizing Provider  cetirizine  (ZYRTEC ) 10 MG tablet Take 1 tablet (10 mg total) by mouth daily. 03/28/24  Yes Leath-Warren, Etta PARAS, NP  fluticasone  (FLONASE ) 50 MCG/ACT nasal spray Place 2  sprays into both nostrils daily. 03/28/24  Yes Leath-Warren, Etta PARAS, NP  lisinopril  (ZESTRIL ) 20 MG tablet Take 20 mg by mouth daily. 05/28/19  Yes [provider]  albuterol  (VENTOLIN  HFA) 108 (90 Base) MCG/ACT inhaler Inhale 2 puffs into the lungs every 4 (four) hours as needed. 04/18/23   Chandra Harlene LABOR, NP  azithromycin  (ZITHROMAX ) 250 MG tablet Take (2) tablets by mouth on day 1, then take (1) tablet by mouth on days 2-5. 04/18/23   Chandra Harlene LABOR, NP  benzonatate  (TESSALON ) 100 MG capsule Take 1-2 capsules (100-200 mg total) by mouth 3 (three) times daily as needed for cough. 08/02/21   Christopher Savannah, PA-C  cyclobenzaprine  (FLEXERIL ) 10 MG tablet Take 1 tablet (10 mg total) by mouth at bedtime. 11/11/21   Leath-Warren, Etta PARAS, NP  ibuprofen  (ADVIL ) 800 MG tablet Take 1 tablet (800 mg total) by mouth every 8 (eight) hours as needed. 11/11/21   Leath-Warren, Etta PARAS, NP    Family History Family History  Problem Relation Age of Onset   Colon cancer Maternal Uncle    Colon cancer Paternal Aunt     Social History Social History   Tobacco Use   Smoking status: Former    Current packs/day: 0.25    Types: Cigarettes   Smokeless tobacco: Never  Vaping Use   Vaping status: Never Used  Substance Use Topics   Alcohol use: No   Drug use: Never  Allergies   Patient has no known allergies.   Review of Systems Review of Systems Per HPI  Physical Exam Triage Vital Signs ED Triage Vitals  Encounter Vitals Group     BP 03/28/24 0821 (!) 147/82     Girls Systolic BP Percentile --      Girls Diastolic BP Percentile --      Boys Systolic BP Percentile --      Boys Diastolic BP Percentile --      Pulse Rate 03/28/24 0821 87     Resp 03/28/24 0821 16     Temp 03/28/24 0821 99.1 F (37.3 C)     Temp Source 03/28/24 0821 Oral     SpO2 03/28/24 0821 94 %     Weight --      Height --      Head Circumference --      Peak Flow --      Pain Score 03/28/24  0822 4     Pain Loc --      Pain Education --      Exclude from Growth Chart --    No data found.  Updated Vital Signs BP (!) 147/82 (BP Location: Right Arm)   Pulse 87   Temp 99.1 F (37.3 C) (Oral)   Resp 16   SpO2 94%   Visual Acuity Right Eye Distance:   Left Eye Distance:   Bilateral Distance:    Right Eye Near:   Left Eye Near:    Bilateral Near:     Physical Exam Vitals and nursing note reviewed.  Constitutional:      General: He is not in acute distress.    Appearance: He is well-developed.  HENT:     Head: Normocephalic.     Right Ear: Tympanic membrane and ear canal normal.     Left Ear: Tympanic membrane and ear canal normal.     Nose: Nose normal.     Right Turbinates: Enlarged and swollen.     Left Turbinates: Enlarged and swollen.     Right Sinus: No maxillary sinus tenderness or frontal sinus tenderness.     Left Sinus: No maxillary sinus tenderness or frontal sinus tenderness.     Mouth/Throat:     Lips: Pink.     Mouth: Mucous membranes are moist.     Pharynx: Posterior oropharyngeal erythema and postnasal drip present. No pharyngeal swelling, oropharyngeal exudate or uvula swelling.     Comments: Cobblestoning present to posterior oropharynx  Eyes:     Extraocular Movements: Extraocular movements intact.     Pupils: Pupils are equal, round, and reactive to light.  Cardiovascular:     Rate and Rhythm: Normal rate and regular rhythm.     Pulses: Normal pulses.     Heart sounds: Normal heart sounds.  Pulmonary:     Effort: Pulmonary effort is normal. No respiratory distress.     Breath sounds: Normal breath sounds. No stridor. No wheezing, rhonchi or rales.  Musculoskeletal:     Cervical back: Normal range of motion.  Lymphadenopathy:     Cervical: No cervical adenopathy.  Skin:    General: Skin is warm and dry.  Neurological:     General: No focal deficit present.     Mental Status: He is alert and oriented to person, place, and time.   Psychiatric:        Mood and Affect: Mood normal.        Behavior: Behavior normal.  UC Treatments / Results  Labs (all labs ordered are listed, but only abnormal results are displayed) Labs Reviewed  POCT RAPID STREP A (OFFICE)  POC SOFIA SARS ANTIGEN FIA    EKG   Radiology No results found.  Procedures Procedures (including critical care time)  Medications Ordered in UC Medications - No data to display  Initial Impression / Assessment and Plan / UC Course  I have reviewed the triage vital signs and the nursing notes.  Pertinent labs & imaging results that were available during my care of the patient were reviewed by me and considered in my medical decision making (see chart for details).  The rapid strep test and COVID test were negative.  On exam, the patient's lung sounds are clear throughout, room air sats are 94%.  The patient is well-appearing, is in no acute distress, vital signs are stable.  Symptoms consistent with viral etiology.  Will provide symptomatic treatment with fluticasone  50 mcg nasal spray for postnasal drainage, and cetirizine  10 mg as an antihistamine.  Supportive care recommendations were provided and discussed with the patient to include over-the-counter Tylenol , warm salt water  gargles, use of normal saline nasal spray, and to monitor for worsening symptoms.  Discussed indications with the patient regarding follow-up.  Patient was in agreement with this plan of care and verbalizes understanding.  All questions were answered.  Patient stable for discharge.   Final Clinical Impressions(s) / UC Diagnoses   Final diagnoses:  Sore throat  Viral URI     Discharge Instructions      The rapid strep test and COVID test were negative. Take medication as prescribed. You may take over-the-counter Tylenol  as needed for pain, fever, or general discomfort. Recommend warm salt water  gargles 3-4 times daily as needed for throat pain or discomfort.  You  may also use over-the-counter Chloraseptic throat spray or throat lozenges while symptoms persist. Recommend normal saline nasal spray throughout the day for nasal congestion or runny nose. Your symptoms should begin to improve over the next 5 to 7 days.  If symptoms fail to improve, or begin to worsen, you may follow-up in this clinic or with your primary care physician for further evaluation. Follow-up as needed.      ED Prescriptions     Medication Sig Dispense Auth. Provider   cetirizine  (ZYRTEC ) 10 MG tablet Take 1 tablet (10 mg total) by mouth daily. 30 tablet Leath-Warren, Etta PARAS, NP   fluticasone  (FLONASE ) 50 MCG/ACT nasal spray Place 2 sprays into both nostrils daily. 16 g Leath-Warren, Etta PARAS, NP      PDMP not reviewed this encounter.   Gilmer Etta PARAS, NP 03/28/24 [phone]

## 2024-03-28 NOTE — Discharge Instructions (Signed)
 The rapid strep test and COVID test were negative. Take medication as prescribed. You may take over-the-counter Tylenol  as needed for pain, fever, or general discomfort. Recommend warm salt water  gargles 3-4 times daily as needed for throat pain or discomfort.  You may also use over-the-counter Chloraseptic throat spray or throat lozenges while symptoms persist. Recommend normal saline nasal spray throughout the day for nasal congestion or runny nose. Your symptoms should begin to improve over the next 5 to 7 days.  If symptoms fail to improve, or begin to worsen, you may follow-up in this clinic or with your primary care physician for further evaluation. Follow-up as needed.

## 2024-03-31 ENCOUNTER — Ambulatory Visit
Admission: EM | Admit: 2024-03-31 | Discharge: 2024-03-31 | Disposition: A | Discharge status: Home / Self Care | Attending: Family Medicine | Admitting: Family Medicine

## 2024-03-31 DIAGNOSIS — R062 Wheezing: Secondary | ICD-10-CM

## 2024-03-31 DIAGNOSIS — Z87891 Personal history of nicotine dependence: Secondary | ICD-10-CM

## 2024-03-31 DIAGNOSIS — J208 Acute bronchitis due to other specified organisms: Secondary | ICD-10-CM

## 2024-03-31 MED ORDER — ALBUTEROL SULFATE HFA 108 (90 BASE) MCG/ACT IN AERS: 2.0000 | INHALATION_SPRAY | RESPIRATORY_TRACT | 0 refills | Status: AC | PRN

## 2024-03-31 MED ORDER — DEXAMETHASONE SOD PHOSPHATE PF 10 MG/ML IJ SOLN
10.0000 mg | Freq: Once | INTRAMUSCULAR | Status: AC
  Administered 2024-03-31: 10 mg via INTRAMUSCULAR

## 2024-03-31 MED ORDER — PROMETHAZINE-DM 6.25-15 MG/5ML PO SYRP: 5.0000 mL | ORAL_SOLUTION | Freq: Four times a day (QID) | ORAL | 0 refills | Status: AC | PRN

## 2024-03-31 NOTE — ED Provider Notes (Signed)
 [facility]    CSN: [id number] Arrival date & time: 03/31/24  0904      History   Chief Complaint No chief complaint on file.   HPI Robert Montoya is a 68 y.o. male.   Patient presenting today with 5-day history of progressively worsening cough, occasional wheezing, congestion, sinus pressure, headache, fatigue.  Denies chest pain, shortness of breath, abdominal pain, vomiting, diarrhea.  Was seen on 03/28/2024 and prescribed Flonase  and Zyrtec  which he states has not been helping.  Not trying anything over-the-counter for symptoms.  No known diagnosed chronic pulmonary disease but is a former chronic smoker, quit last year per patient.  Has had albuterol  inhalers when sick in the past but does not currently have 1.    Past Medical History:  Diagnosis Date   GERD (gastroesophageal reflux disease)    Hemorrhoids     Patient Active Problem List   Diagnosis Date Noted   IBS (irritable bowel syndrome) 04/02/2021   Lactose intolerance 04/02/2021   Bloating 04/02/2021   External hemorrhoids 04/02/2021   Cholelithiasis 07/17/2020   Tobacco abuse 08/12/2019    Past Surgical History:  Procedure Laterality Date   BIOPSY  08/13/2019   Procedure: BIOPSY;  Surgeon: Golda Claudis PENNER, MD;  Location: [facility];  Service: Endoscopy;;   CARDIAC CATHETERIZATION     COLONOSCOPY N/A 12/05/2013   Procedure: COLONOSCOPY;  Surgeon: Claudis PENNER Golda, MD;  Location: [facility];  Service: Endoscopy;  Laterality: N/A;  1200   COLONOSCOPY N/A 08/13/2019   Procedure: COLONOSCOPY;  Surgeon: Golda Claudis PENNER, MD;  Location: [facility];  Service: Endoscopy;  Laterality: N/A;   POLYPECTOMY  08/13/2019   Procedure: POLYPECTOMY;  Surgeon: Golda Claudis PENNER, MD;  Location: [facility];  Service: Endoscopy;;       Home Medications    Prior to Admission medications   Medication Sig Start Date End Date Taking? Authorizing Provider  promethazine -dextromethorphan (PROMETHAZINE -DM)  6.25-15 MG/5ML syrup Take 5 mLs by mouth 4 (four) times daily as needed. 03/31/24  Yes Stuart Vernell Norris, PA-C  albuterol  (VENTOLIN  HFA) 108 [phone] Base) MCG/ACT inhaler Inhale 2 puffs into the lungs every 4 (four) hours as needed. 03/31/24   Stuart Vernell Norris, PA-C  azithromycin  (ZITHROMAX ) 250 MG tablet Take (2) tablets by mouth on day 1, then take (1) tablet by mouth on days 2-5. 04/18/23   Chandra Harlene LABOR, NP  benzonatate  (TESSALON ) 100 MG capsule Take 1-2 capsules (100-200 mg total) by mouth 3 (three) times daily as needed for cough. 08/02/21   Christopher Savannah, PA-C  cetirizine  (ZYRTEC ) 10 MG tablet Take 1 tablet (10 mg total) by mouth daily. 03/28/24   Leath-Warren, Etta PARAS, NP  cyclobenzaprine  (FLEXERIL ) 10 MG tablet Take 1 tablet (10 mg total) by mouth at bedtime. 11/11/21   Leath-Warren, Etta PARAS, NP  fluticasone  (FLONASE ) 50 MCG/ACT nasal spray Place 2 sprays into both nostrils daily. 03/28/24   Leath-Warren, Etta PARAS, NP  ibuprofen  (ADVIL ) 800 MG tablet Take 1 tablet (800 mg total) by mouth every 8 (eight) hours as needed. 11/11/21   Leath-Warren, Etta PARAS, NP  lisinopril  (ZESTRIL ) 20 MG tablet Take 20 mg by mouth daily. 05/28/19   [provider]    Family History Family History  Problem Relation Age of Onset   Colon cancer Maternal Uncle    Colon cancer Paternal Aunt     Social History Social History   Tobacco Use   Smoking status: Former  Current packs/day: 0.25    Types: Cigarettes   Smokeless tobacco: Never  Vaping Use   Vaping status: Never Used  Substance Use Topics   Alcohol use: No   Drug use: Never     Allergies   Patient has no known allergies.   Review of Systems Review of Systems Per HPI  Physical Exam Triage Vital Signs ED Triage Vitals  Encounter Vitals Group     BP 03/31/24 1057 (!) 147/76     Girls Systolic BP Percentile --      Girls Diastolic BP Percentile --      Boys Systolic BP Percentile --      Boys Diastolic  BP Percentile --      Pulse Rate 03/31/24 1057 87     Resp 03/31/24 1057 20     Temp 03/31/24 1057 98.3 F (36.8 C)     Temp Source 03/31/24 1057 Oral     SpO2 03/31/24 1057 94 %     Weight --      Height --      Head Circumference --      Peak Flow --      Pain Score 03/31/24 1100 8     Pain Loc --      Pain Education --      Exclude from Growth Chart --    No data found.  Updated Vital Signs BP (!) 147/76 (BP Location: Right Arm)   Pulse 87   Temp 98.3 F (36.8 C) (Oral)   Resp 20   SpO2 94%   Visual Acuity Right Eye Distance:   Left Eye Distance:   Bilateral Distance:    Right Eye Near:   Left Eye Near:    Bilateral Near:     Physical Exam Vitals and nursing note reviewed.  Constitutional:      Appearance: He is well-developed.  HENT:     Head: Atraumatic.     Right Ear: Tympanic membrane and external ear normal.     Left Ear: Tympanic membrane and external ear normal.     Nose: Rhinorrhea present.     Mouth/Throat:     Pharynx: Posterior oropharyngeal erythema present. No oropharyngeal exudate.  Eyes:     Conjunctiva/sclera: Conjunctivae normal.     Pupils: Pupils are equal, round, and reactive to light.  Cardiovascular:     Rate and Rhythm: Normal rate and regular rhythm.  Pulmonary:     Effort: Pulmonary effort is normal. No respiratory distress.     Breath sounds: Wheezing present. No rales.  Musculoskeletal:        General: Normal range of motion.     Cervical back: Normal range of motion and neck supple.  Lymphadenopathy:     Cervical: No cervical adenopathy.  Skin:    General: Skin is warm and dry.  Neurological:     Mental Status: He is alert and oriented to person, place, and time.  Psychiatric:        Behavior: Behavior normal.      UC Treatments / Results  Labs (all labs ordered are listed, but only abnormal results are displayed) Labs Reviewed - No data to display  EKG   Radiology No results found.  Procedures Procedures  (including critical care time)  Medications Ordered in UC Medications  dexamethasone  (DECADRON ) injection 10 mg (10 mg Intramuscular Given 03/31/24 1145)    Initial Impression / Assessment and Plan / UC Course  I have reviewed the triage vital signs and  the nursing notes.  Pertinent labs & imaging results that were available during my care of the patient were reviewed by me and considered in my medical decision making (see chart for details).     Vital signs and exam very reassuring today, suspect viral infection leading to bronchitis.  He may also have some chronic underlying changes from chronic smoking.  Will treat with IM Decadron , albuterol , Phenergan  DM, supportive over-the-counter medications and home care.  Return for worsening or unresolving symptoms.  Final Clinical Impressions(s) / UC Diagnoses   Final diagnoses:  Viral bronchitis  Former smoker  Wheezing     Discharge Instructions      Have given you a steroid shot today to help with your symptoms and prescribed an albuterol  inhaler and cough syrup.  Continue taking the Flonase , Zyrtec  and you may add saline sinus rinses, Coricidin HBP, plain Mucinex, humidifiers as needed for symptom relief.  Follow-up for worsening or unresolving symptoms.    ED Prescriptions     Medication Sig Dispense Auth. Provider   albuterol  (VENTOLIN  HFA) 108 (90 Base) MCG/ACT inhaler Inhale 2 puffs into the lungs every 4 (four) hours as needed. 18 g Stuart Vernell Norris, PA-C   promethazine -dextromethorphan (PROMETHAZINE -DM) 6.25-15 MG/5ML syrup Take 5 mLs by mouth 4 (four) times daily as needed. 100 mL Stuart Vernell Norris, [place]      PDMP not reviewed this encounter.   Stuart Vernell Norris, PA-C 03/31/24 1159

## 2024-03-31 NOTE — ED Triage Notes (Signed)
 Pt reports cough, congestion, sinus pressure x 5 days was seen on 03/28/2024, treated with Flonase  nasal spray and zyrtec . Pt states he is now coughing up yellow and green colored mucus.

## 2024-03-31 NOTE — Discharge Instructions (Signed)
 Have given you a steroid shot today to help with your symptoms and prescribed an albuterol  inhaler and cough syrup.  Continue taking the Flonase , Zyrtec  and you may add saline sinus rinses, Coricidin HBP, plain Mucinex, humidifiers as needed for symptom relief.  Follow-up for worsening or unresolving symptoms.
